# Patient Record
Sex: Female | Born: 1955
Health system: Southern US, Community
[De-identification: ages and names within clinical notes are randomized; demographics above are authoritative.]

## PROBLEM LIST (undated history)

## (undated) DIAGNOSIS — F419 Anxiety disorder, unspecified: Secondary | ICD-10-CM

## (undated) DIAGNOSIS — C3491 Malignant neoplasm of unspecified part of right bronchus or lung: Secondary | ICD-10-CM

## (undated) DIAGNOSIS — I10 Essential (primary) hypertension: Secondary | ICD-10-CM

## (undated) HISTORY — DX: Essential (primary) hypertension: I10

## (undated) HISTORY — DX: Anxiety disorder, unspecified: F41.9

## (undated) HISTORY — DX: Malignant neoplasm of unspecified part of right bronchus or lung: C34.91

## (undated) HISTORY — PX: COLONOSCOPY: SHX174

---

## 2018-03-07 DIAGNOSIS — R000 Tachycardia, unspecified: Secondary | ICD-10-CM | POA: Diagnosis not present

## 2018-03-14 DIAGNOSIS — G5603 Carpal tunnel syndrome, bilateral upper limbs: Secondary | ICD-10-CM | POA: Diagnosis not present

## 2018-03-14 DIAGNOSIS — G4739 Other sleep apnea: Secondary | ICD-10-CM | POA: Diagnosis not present

## 2018-03-14 DIAGNOSIS — R079 Chest pain, unspecified: Secondary | ICD-10-CM | POA: Diagnosis not present

## 2018-03-14 DIAGNOSIS — I499 Cardiac arrhythmia, unspecified: Secondary | ICD-10-CM | POA: Diagnosis not present

## 2018-03-17 ENCOUNTER — Other Ambulatory Visit: Payer: Self-pay | Admitting: Family Medicine

## 2018-03-17 ENCOUNTER — Ambulatory Visit
Admission: RE | Admit: 2018-03-17 | Discharge: 2018-03-17 | Disposition: A | Discharge status: Home / Self Care | Payer: BLUE CROSS/BLUE SHIELD | Source: Ambulatory Visit | Attending: Family Medicine | Admitting: Family Medicine

## 2018-03-17 DIAGNOSIS — R079 Chest pain, unspecified: Secondary | ICD-10-CM | POA: Diagnosis not present

## 2018-03-31 ENCOUNTER — Other Ambulatory Visit: Payer: Self-pay | Admitting: Family Medicine

## 2018-03-31 DIAGNOSIS — R911 Solitary pulmonary nodule: Secondary | ICD-10-CM

## 2018-04-03 ENCOUNTER — Ambulatory Visit
Admission: RE | Admit: 2018-04-03 | Discharge: 2018-04-03 | Disposition: A | Discharge status: Home / Self Care | Payer: BLUE CROSS/BLUE SHIELD | Source: Ambulatory Visit | Attending: Family Medicine | Admitting: Family Medicine

## 2018-04-03 DIAGNOSIS — R918 Other nonspecific abnormal finding of lung field: Secondary | ICD-10-CM | POA: Diagnosis not present

## 2018-04-03 DIAGNOSIS — R911 Solitary pulmonary nodule: Secondary | ICD-10-CM

## 2018-04-03 MED ORDER — IOPAMIDOL (ISOVUE-300) INJECTION 61%
75.0000 mL | Freq: Once | INTRAVENOUS | Status: AC | PRN
  Administered 2018-04-03: 75 mL via INTRAVENOUS

## 2018-04-11 DIAGNOSIS — R079 Chest pain, unspecified: Secondary | ICD-10-CM | POA: Diagnosis not present

## 2018-04-11 DIAGNOSIS — R071 Chest pain on breathing: Secondary | ICD-10-CM | POA: Diagnosis not present

## 2018-04-11 DIAGNOSIS — G4739 Other sleep apnea: Secondary | ICD-10-CM | POA: Diagnosis not present

## 2018-04-11 DIAGNOSIS — G5603 Carpal tunnel syndrome, bilateral upper limbs: Secondary | ICD-10-CM | POA: Diagnosis not present

## 2018-04-24 ENCOUNTER — Other Ambulatory Visit (HOSPITAL_BASED_OUTPATIENT_CLINIC_OR_DEPARTMENT_OTHER): Payer: Self-pay

## 2018-04-24 DIAGNOSIS — G4730 Sleep apnea, unspecified: Secondary | ICD-10-CM

## 2018-05-05 DIAGNOSIS — M542 Cervicalgia: Secondary | ICD-10-CM | POA: Diagnosis not present

## 2018-05-05 DIAGNOSIS — M79601 Pain in right arm: Secondary | ICD-10-CM | POA: Diagnosis not present

## 2018-05-05 DIAGNOSIS — M4722 Other spondylosis with radiculopathy, cervical region: Secondary | ICD-10-CM | POA: Diagnosis not present

## 2018-05-14 ENCOUNTER — Ambulatory Visit (HOSPITAL_BASED_OUTPATIENT_CLINIC_OR_DEPARTMENT_OTHER): Payer: BLUE CROSS/BLUE SHIELD | Attending: Family Medicine | Admitting: Internal Medicine

## 2018-05-14 DIAGNOSIS — G4733 Obstructive sleep apnea (adult) (pediatric): Secondary | ICD-10-CM | POA: Diagnosis not present

## 2018-05-14 DIAGNOSIS — G4730 Sleep apnea, unspecified: Secondary | ICD-10-CM

## 2018-05-22 DIAGNOSIS — G4733 Obstructive sleep apnea (adult) (pediatric): Secondary | ICD-10-CM | POA: Diagnosis not present

## 2018-05-22 NOTE — Procedures (Signed)
  Patient Name: April Rose, April Rose Date: 05/14/2018 Gender: Female D.O.B: 1955-12-12 Age (years): 61 Referring Provider: Lucianne Lei Height (inches): 49 Interpreting Physician: Baird Lyons MD, ABSM Weight (lbs): 145 RPSGT: Jacolyn Reedy BMI: 27 MRN: [id number] Neck Size: 14.00  CLINICAL INFORMATION Sleep Study Type: HST Indication for sleep study: OSA  Epworth Sleepiness Score: 15  SLEEP STUDY TECHNIQUE A multi-channel overnight portable sleep study was performed. The channels recorded were: nasal airflow, thoracic respiratory movement, and oxygen saturation with a pulse oximetry. Snoring was also monitored.  MEDICATIONS Patient self administered medications include: none reported.  SLEEP ARCHITECTURE Patient was studied for 463.7 minutes. The sleep efficiency was 100.0 % and the patient was supine for 99.8%. The arousal index was 0.0 per hour.  RESPIRATORY PARAMETERS The overall AHI was 5.4 per hour, with a central apnea index of 0.0 per hour.  The oxygen nadir was 85% during sleep.  CARDIAC DATA Mean heart rate during sleep was 73.2 bpm.  IMPRESSIONS - Mild obstructive sleep apnea occurred during this study (AHI = 5.4/h). - No significant central sleep apnea occurred during this study (CAI = 0.0/h). - Moderate oxygen desaturation was noted during this study (Min O2 = 85%, Mean 95%). - Patient snored.  DIAGNOSIS - Obstructive Sleep Apnea (327.23 [G47.33 ICD-10])  RECOMMENDATIONS - Treatment for minimal OSA is directed by symptoms. Conservative measures might include observation, weight loss, sleep position off back. - Other options such as CPAP or a fitted oral appliance, might be appropriate based on clinical judgment. - Avoid alcohol, sedatives and other CNS depressants that may worsen sleep apnea and disrupt normal sleep architecture. - Sleep hygiene should be reviewed to assess factors that may improve sleep quality. - Weight management and regular  exercise should be initiated or continued.  [Electronically signed] 05/22/2018 12:38 PM  Baird Lyons MD, [place], American Board of Sleep Medicine   NPI: [id number]                           [place]  ELECTRONICALLY SIGNED ON:  05/22/2018, 12:35 PM [place] PH: [phone]   FX: [phone] [place]

## 2018-07-03 DIAGNOSIS — R079 Chest pain, unspecified: Secondary | ICD-10-CM | POA: Diagnosis not present

## 2018-07-03 DIAGNOSIS — G4730 Sleep apnea, unspecified: Secondary | ICD-10-CM | POA: Diagnosis not present

## 2018-12-01 DIAGNOSIS — Z1212 Encounter for screening for malignant neoplasm of rectum: Secondary | ICD-10-CM | POA: Diagnosis not present

## 2018-12-01 DIAGNOSIS — Z1211 Encounter for screening for malignant neoplasm of colon: Secondary | ICD-10-CM | POA: Diagnosis not present

## 2019-05-05 DIAGNOSIS — G4739 Other sleep apnea: Secondary | ICD-10-CM | POA: Diagnosis not present

## 2019-05-05 DIAGNOSIS — R7303 Prediabetes: Secondary | ICD-10-CM | POA: Diagnosis not present

## 2019-05-05 DIAGNOSIS — Z6838 Body mass index (BMI) 38.0-38.9, adult: Secondary | ICD-10-CM | POA: Diagnosis not present

## 2019-05-05 DIAGNOSIS — H68103 Unspecified obstruction of Eustachian tube, bilateral: Secondary | ICD-10-CM | POA: Diagnosis not present

## 2019-05-05 DIAGNOSIS — E782 Mixed hyperlipidemia: Secondary | ICD-10-CM | POA: Diagnosis not present

## 2019-08-27 DIAGNOSIS — E782 Mixed hyperlipidemia: Secondary | ICD-10-CM | POA: Diagnosis not present

## 2019-08-27 DIAGNOSIS — R7303 Prediabetes: Secondary | ICD-10-CM | POA: Diagnosis not present

## 2019-08-27 DIAGNOSIS — Z7189 Other specified counseling: Secondary | ICD-10-CM | POA: Diagnosis not present

## 2019-08-27 DIAGNOSIS — T50B95A Adverse effect of other viral vaccines, initial encounter: Secondary | ICD-10-CM | POA: Diagnosis not present

## 2019-11-02 IMAGING — CR DG CHEST 2V
2 series · 2 of 2 positions shown · non-contrast
Comparison: None.

CLINICAL DATA: Right-sided chest pain

EXAM:
CHEST - 2 VIEW

[w chest pa]
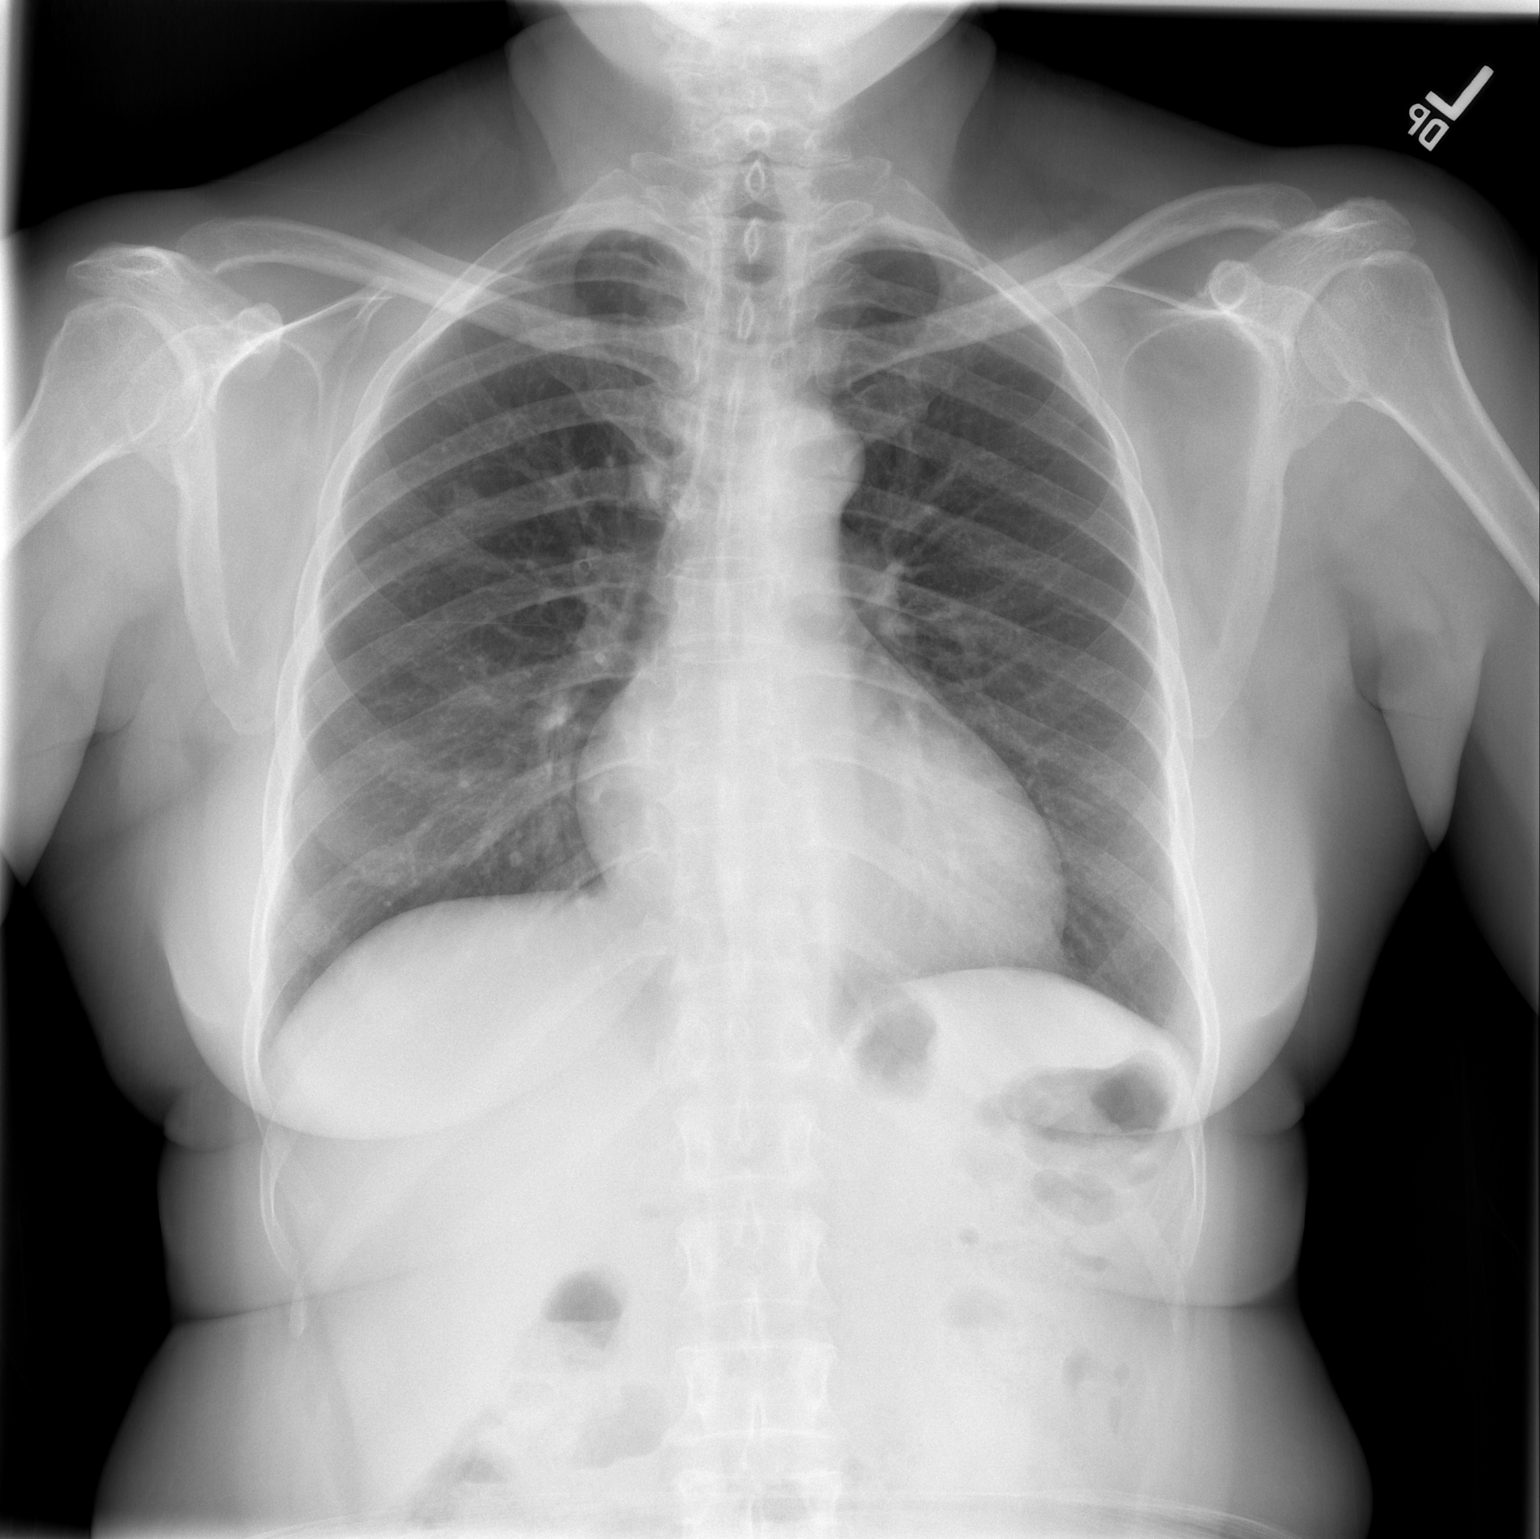

[w chest lat]
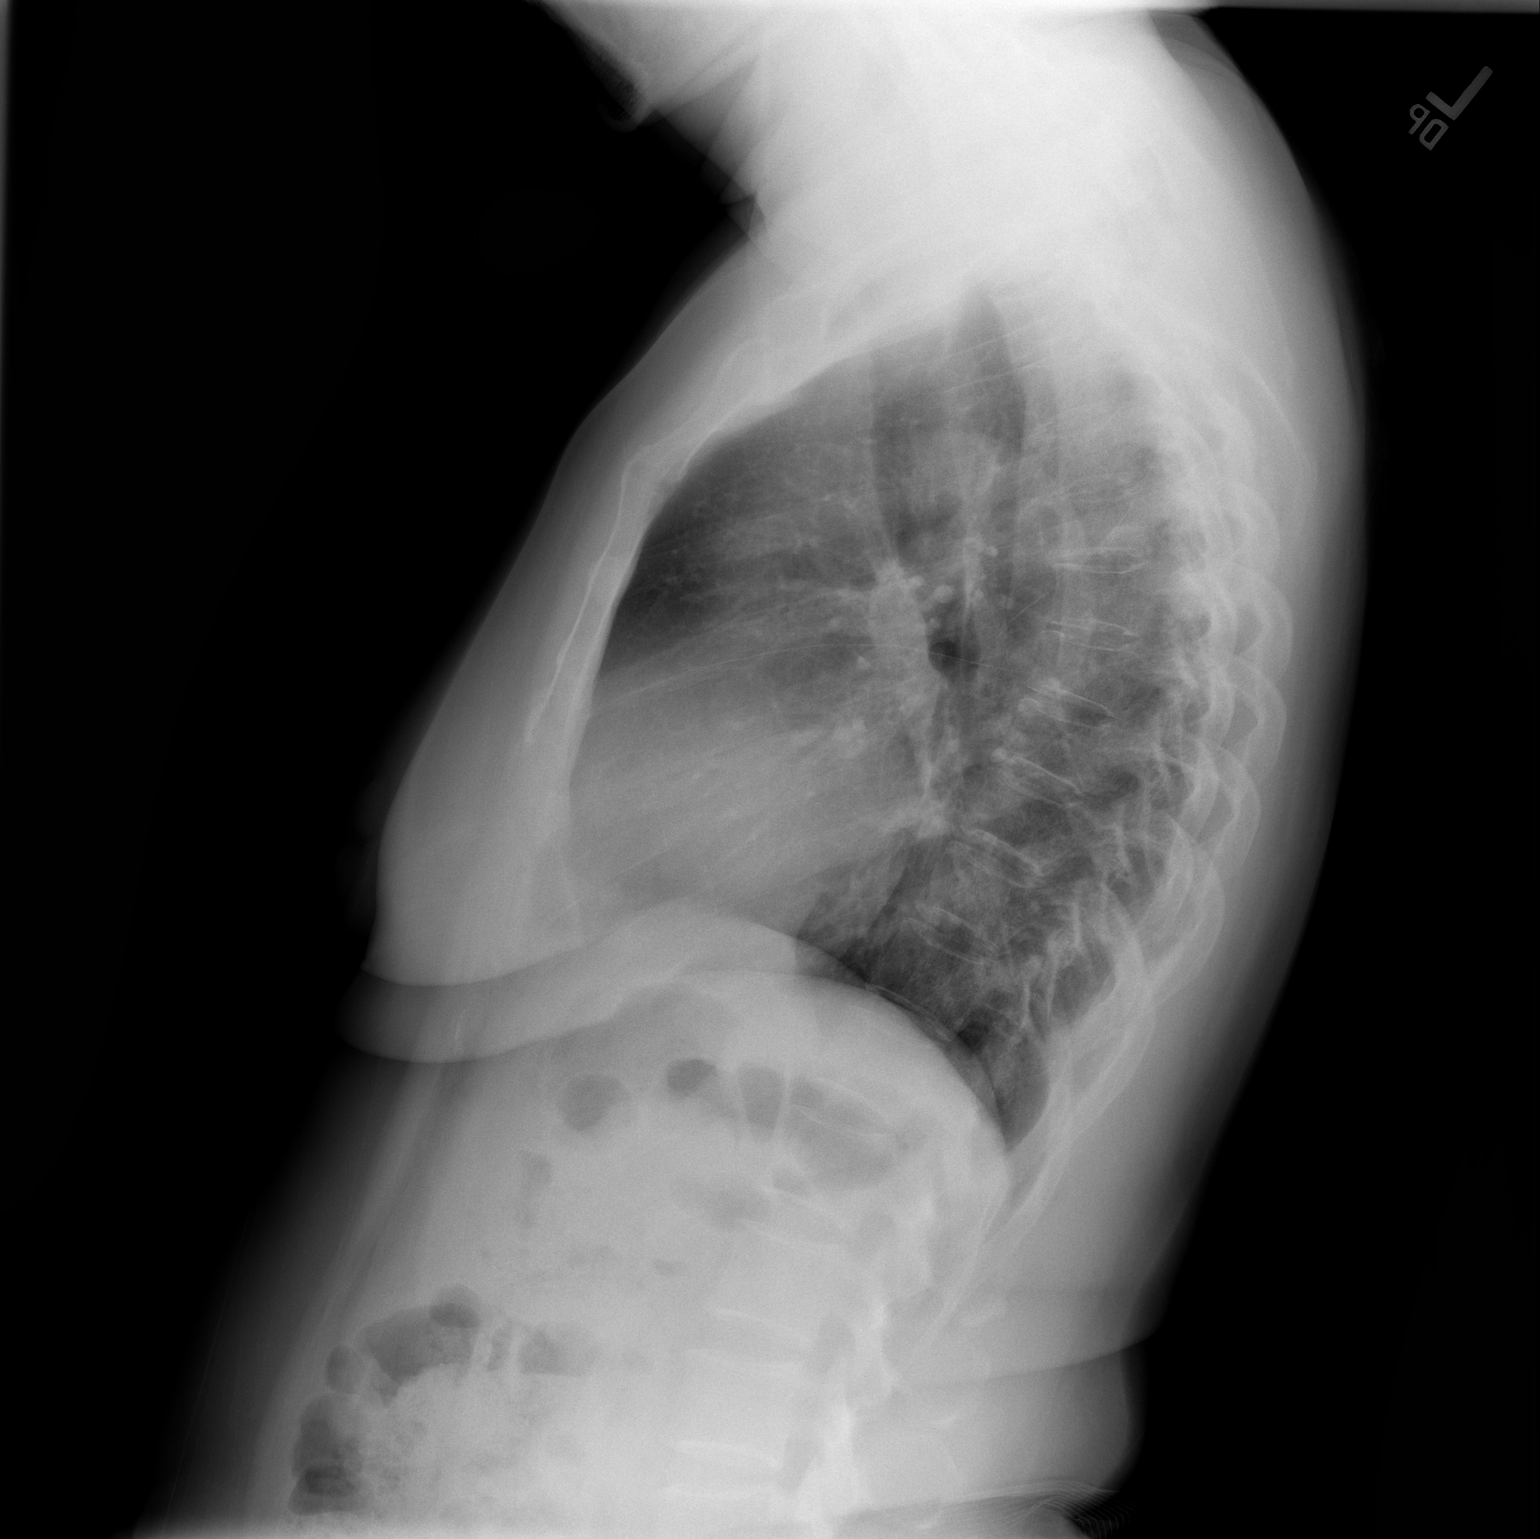

[2 of 2 positions shown; findings below may reference images not displayed]

FINDINGS: No acute pulmonary infiltrate or effusion. Normal cardiomediastinal
silhouette. No pneumothorax. Possible 8 mm right upper lobe lung
nodule.
IMPRESSION: 1. No radiographic evidence for acute cardiopulmonary abnormality
2. Possible 8 mm right upper lobe lung nodule. Recommend CT chest
for further evaluation.

These results will be called to the ordering clinician or
representative by the Radiologist Assistant, and communication
documented in the PACS or zVision Dashboard.

## 2019-11-19 IMAGING — CT CT CHEST W/ CM
2 of 4 series · 12 of 36 positions shown, 15 images · IV contrast (iopamidol)
Comparison: None.

CLINICAL DATA: RIGHT axillary pain and lateral rib pain.

EXAM:
CT CHEST WITH CONTRAST
TECHNIQUE: Multidetector CT imaging of the chest was performed during
intravenous contrast administration.
CONTRAST:  75mL W3JO5Z-GFF IOPAMIDOL (W3JO5Z-GFF) INJECTION 61%

[Series 2: chest 2.00 br40 s3 ax · axial · 0.56mm/px · z∈[+1861,+2075]mm · 9 of 127 slices shown, 12 images]
[im 10/127  mediastinal]
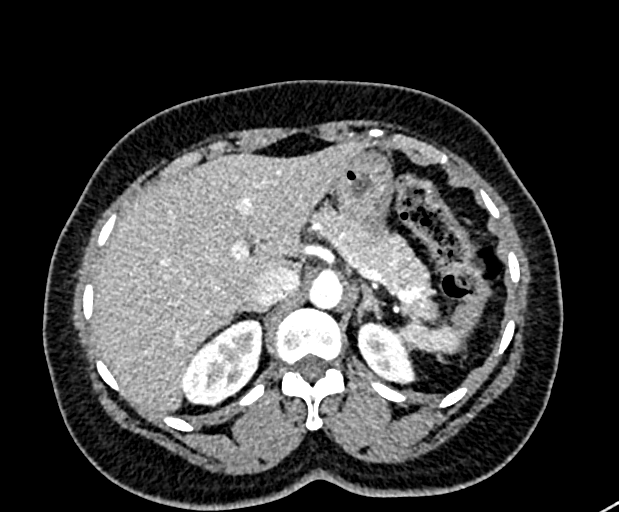
[im 10/127  lung]
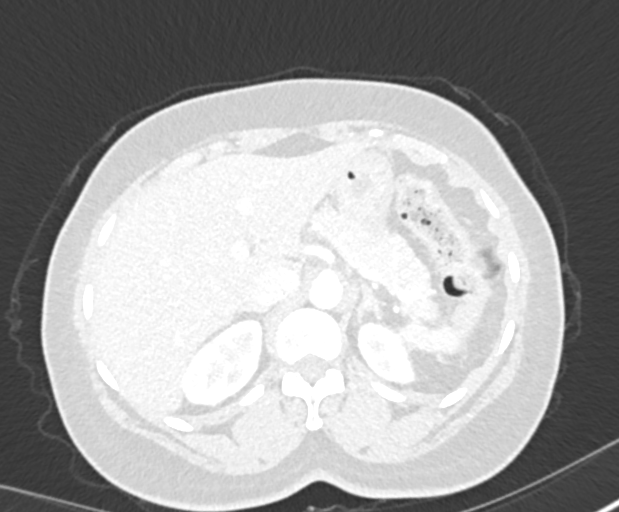
[im 30/127  lung]
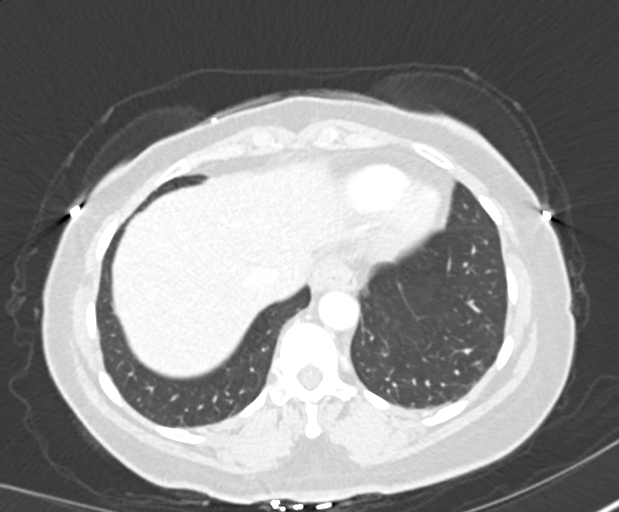
[im 39/127  lung]
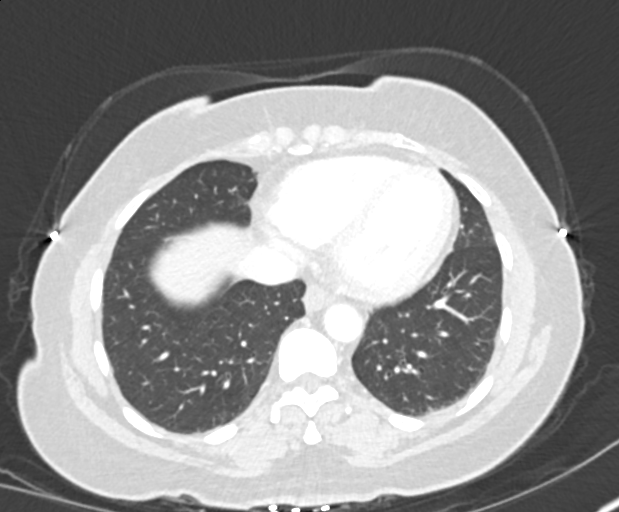
[im 49/127  lung]
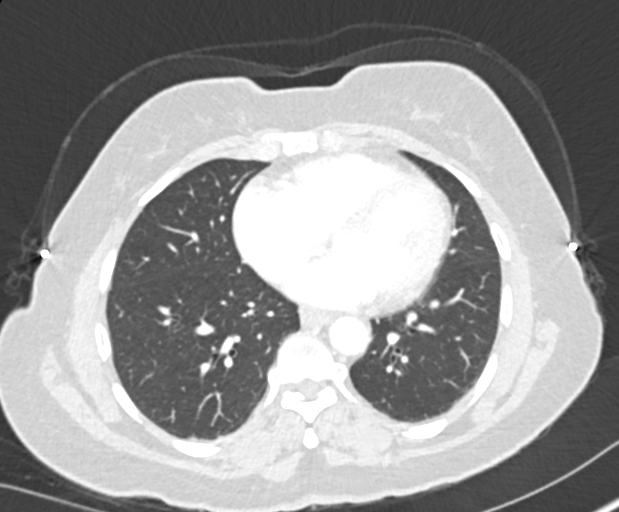
[im 68/127  mediastinal]
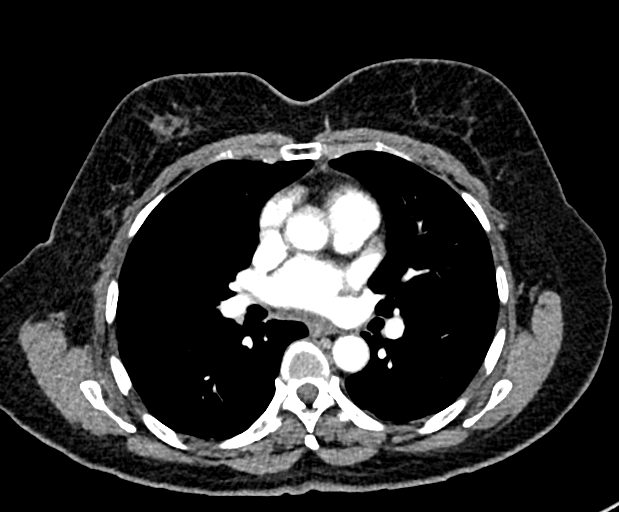
[im 68/127  lung]
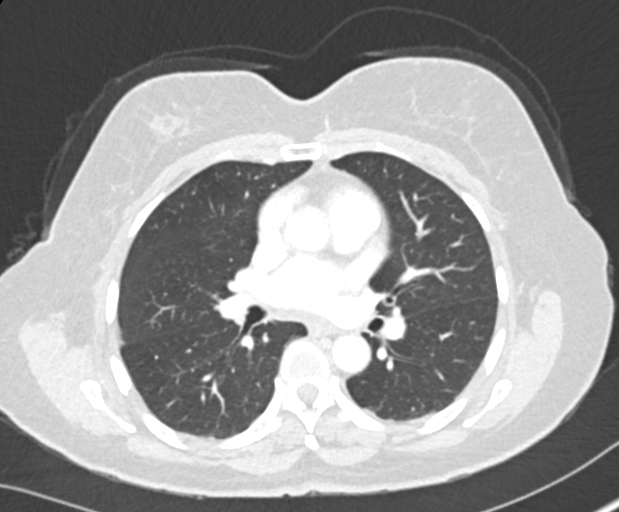
[im 78/127  lung]
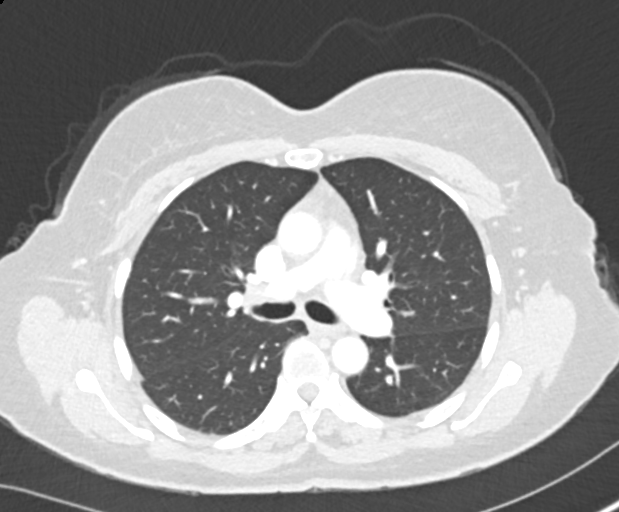
[im 88/127  lung]
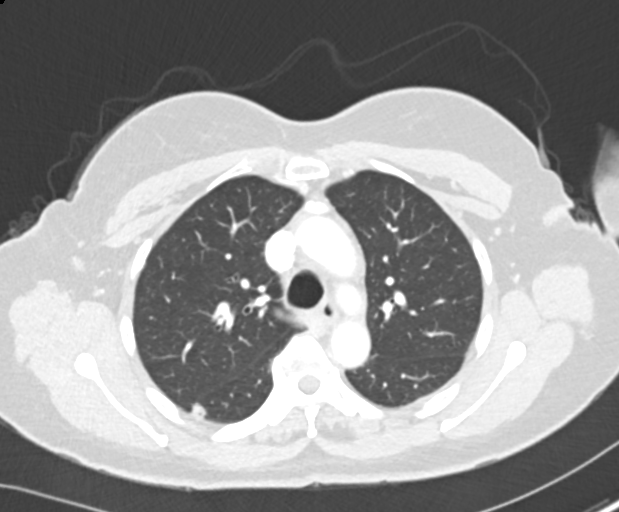
[im 107/127  lung]
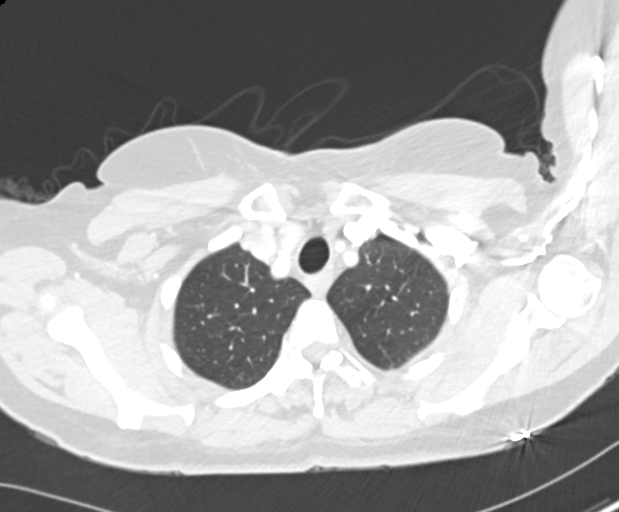
[im 117/127  mediastinal]
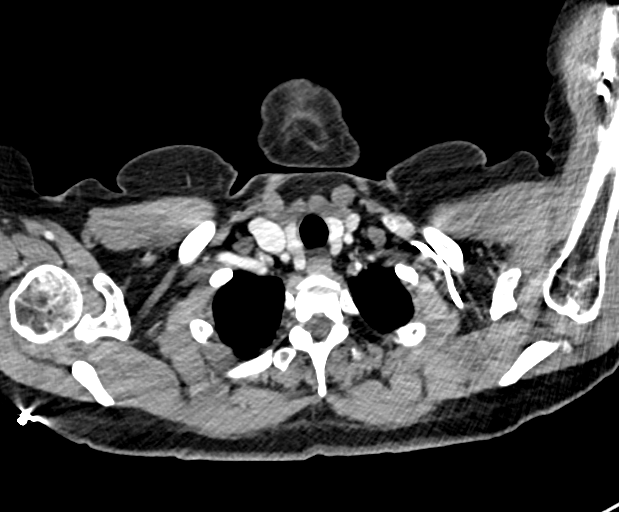
[im 117/127  lung]
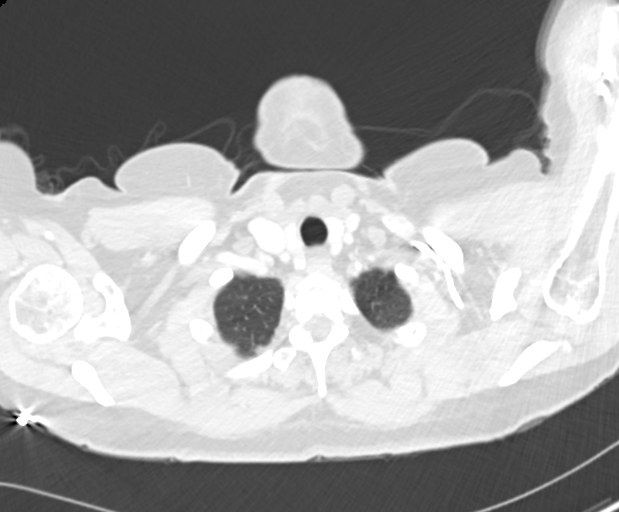

[Series 4: chest 2.00 br40 s3 cor · coronal · 0.50mm/px · 3 of 142 slices shown]
[im 29/142  lung]
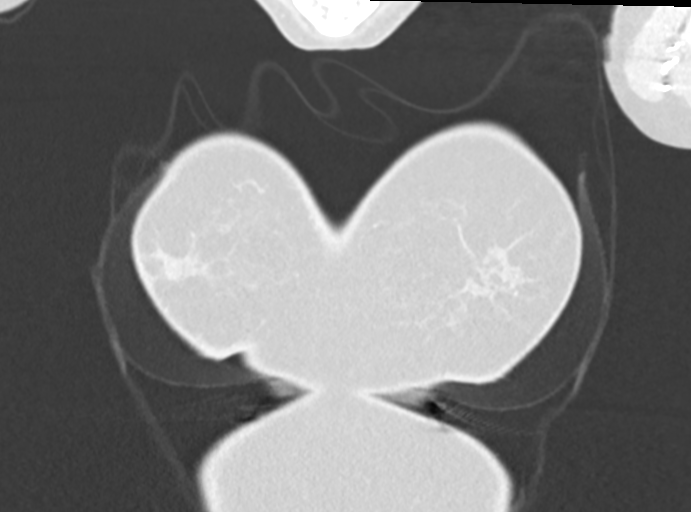
[im 57/142  lung]
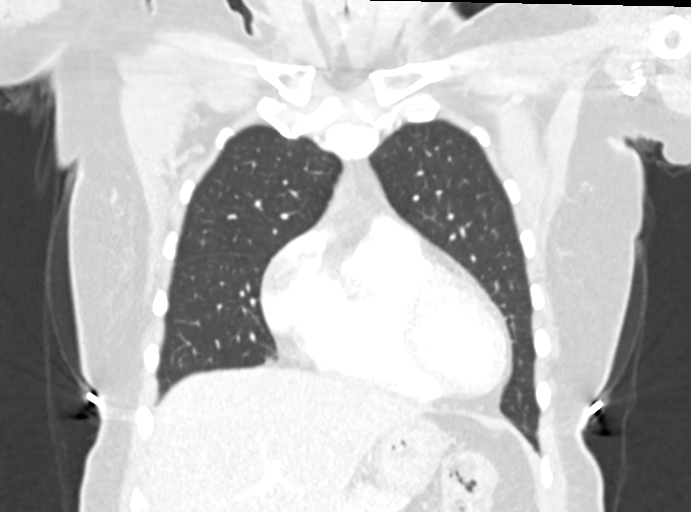
[im 85/142  lung]
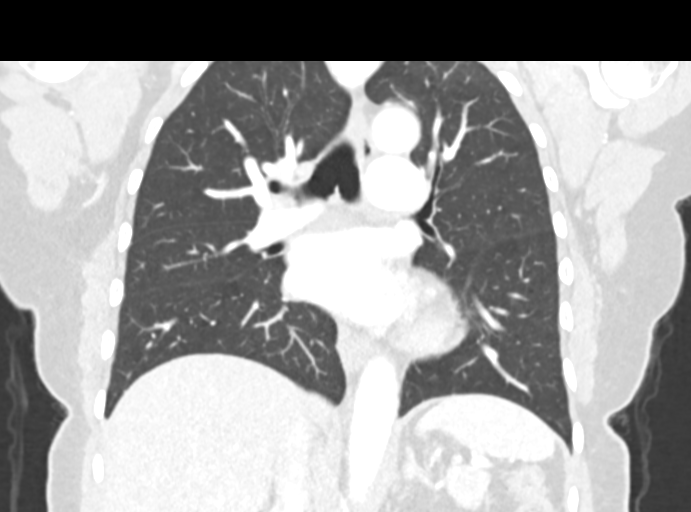

[12 of 36 positions shown; findings below may reference images not displayed]

FINDINGS: Cardiovascular: No significant vascular findings. Normal heart size.
No pericardial effusion.

Mediastinum/Nodes: No axillary supraclavicular adenopathy. No
mediastinal hilar adenopathy. Esophagus normal.

Lungs/Pleura: Subpleural nodule in the superior segment of the RIGHT
lower lobe measures 9 mm (image 41/8). No ground-glass opacities.
Airways normal.

Upper Abdomen: Limited view of the liver, kidneys, pancreas are
unremarkable. Normal adrenal glands.

Musculoskeletal: No RIGHT axillary abnormality chest wall. No rib
abnormality in the RIGHT chest wall or axilla
IMPRESSION: 1. No explanation for RIGHT axillary and RIGHT chest wall pain.
2. RIGHT lower lobe pulmonary nodule. Consider one of the following
in 3 months for both low-risk and high-risk individuals: (a) repeat
chest CT, (b) follow-up PET-CT, or (c) tissue sampling. This
recommendation follows the consensus statement: Guidelines for
Management of Incidental Pulmonary Nodules Detected on CT Images:

## 2019-12-02 DIAGNOSIS — N390 Urinary tract infection, site not specified: Secondary | ICD-10-CM | POA: Diagnosis not present

## 2020-07-28 ENCOUNTER — Other Ambulatory Visit: Payer: Self-pay | Admitting: Internal Medicine

## 2020-07-28 DIAGNOSIS — R911 Solitary pulmonary nodule: Secondary | ICD-10-CM

## 2022-01-09 DIAGNOSIS — Z79899 Other long term (current) drug therapy: Secondary | ICD-10-CM | POA: Diagnosis not present

## 2022-01-09 DIAGNOSIS — I10 Essential (primary) hypertension: Secondary | ICD-10-CM | POA: Diagnosis not present

## 2022-01-09 DIAGNOSIS — R7309 Other abnormal glucose: Secondary | ICD-10-CM | POA: Diagnosis not present

## 2022-01-16 DIAGNOSIS — R911 Solitary pulmonary nodule: Secondary | ICD-10-CM | POA: Diagnosis not present

## 2022-01-16 DIAGNOSIS — I10 Essential (primary) hypertension: Secondary | ICD-10-CM | POA: Diagnosis not present

## 2022-01-16 DIAGNOSIS — F419 Anxiety disorder, unspecified: Secondary | ICD-10-CM | POA: Diagnosis not present

## 2022-01-16 DIAGNOSIS — Z0000 Encounter for general adult medical examination without abnormal findings: Secondary | ICD-10-CM | POA: Diagnosis not present

## 2022-01-18 ENCOUNTER — Other Ambulatory Visit: Payer: Self-pay | Admitting: Internal Medicine

## 2022-01-18 DIAGNOSIS — R911 Solitary pulmonary nodule: Secondary | ICD-10-CM

## 2022-02-14 ENCOUNTER — Other Ambulatory Visit: Payer: BLUE CROSS/BLUE SHIELD

## 2022-02-22 DIAGNOSIS — Z1231 Encounter for screening mammogram for malignant neoplasm of breast: Secondary | ICD-10-CM | POA: Diagnosis not present

## 2022-03-14 ENCOUNTER — Other Ambulatory Visit: Payer: Medicare Other

## 2022-04-04 ENCOUNTER — Encounter: Payer: Self-pay | Admitting: Gastroenterology

## 2022-04-09 ENCOUNTER — Ambulatory Visit (AMBULATORY_SURGERY_CENTER): Payer: Medicare Other | Admitting: *Deleted

## 2022-04-09 VITALS — Ht 62.0 in | Wt 150.0 lb

## 2022-04-09 DIAGNOSIS — Z1211 Encounter for screening for malignant neoplasm of colon: Secondary | ICD-10-CM

## 2022-04-09 MED ORDER — PEG 3350-KCL-NA BICARB-NACL 420 G PO SOLR: 4000.0000 mL | Freq: Once | ORAL | 0 refills | Status: AC

## 2022-04-09 NOTE — Progress Notes (Signed)

## 2022-05-04 ENCOUNTER — Encounter: Payer: Self-pay | Admitting: Gastroenterology

## 2022-05-08 ENCOUNTER — Encounter: Payer: Self-pay | Admitting: Gastroenterology

## 2022-05-08 ENCOUNTER — Ambulatory Visit (AMBULATORY_SURGERY_CENTER): Payer: Medicare Other | Admitting: Gastroenterology

## 2022-05-08 VITALS — BP 120/62 | HR 65 | Temp 98.7°F | Resp 9 | Ht 62.0 in | Wt 150.0 lb

## 2022-05-08 DIAGNOSIS — Z1211 Encounter for screening for malignant neoplasm of colon: Secondary | ICD-10-CM | POA: Diagnosis not present

## 2022-05-08 DIAGNOSIS — D123 Benign neoplasm of transverse colon: Secondary | ICD-10-CM | POA: Diagnosis not present

## 2022-05-08 DIAGNOSIS — D122 Benign neoplasm of ascending colon: Secondary | ICD-10-CM

## 2022-05-08 DIAGNOSIS — D125 Benign neoplasm of sigmoid colon: Secondary | ICD-10-CM

## 2022-05-08 MED ORDER — SODIUM CHLORIDE 0.9 % IV SOLN: 500.0000 mL | Freq: Once | INTRAVENOUS | Status: DC

## 2022-05-08 NOTE — Progress Notes (Signed)
Report to PACU, RN, vss, BBS= Clear.  

## 2022-05-08 NOTE — Progress Notes (Signed)
Called to room to assist during endoscopic procedure.  Patient ID and intended procedure confirmed with present staff. Received instructions for my participation in the procedure from the performing physician.  

## 2022-05-08 NOTE — Progress Notes (Signed)
Pt's states no medical or surgical changes since previsit or office visit. 

## 2022-05-08 NOTE — Patient Instructions (Addendum)
Handouts on polyps and diverticulosis given to you today   YOU HAD AN ENDOSCOPIC PROCEDURE TODAY AT [facility]:   Refer to the procedure report that was given to you for any specific questions about what was found during the examination.  If the procedure report does not answer your questions, please call your gastroenterologist to clarify.  If you requested that your care partner not be given the details of your procedure findings, then the procedure report has been included in a sealed envelope for you to review at your convenience later.  YOU SHOULD EXPECT: Some feelings of bloating in the abdomen. Passage of more gas than usual.  Walking can help get rid of the air that was put into your GI tract during the procedure and reduce the bloating. If you had a lower endoscopy (such as a colonoscopy or flexible sigmoidoscopy) you may notice spotting of blood in your stool or on the toilet paper. If you underwent a bowel prep for your procedure, you may not have a normal bowel movement for a few days.  Please Note:  You might notice some irritation and congestion in your nose or some drainage.  This is from the oxygen used during your procedure.  There is no need for concern and it should clear up in a day or so.  SYMPTOMS TO REPORT IMMEDIATELY:  Following lower endoscopy (colonoscopy or flexible sigmoidoscopy):  Excessive amounts of blood in the stool  Significant tenderness or worsening of abdominal pains  Swelling of the abdomen that is new, acute  Fever of 100F or higher  For urgent or emergent issues, a gastroenterologist can be reached at any hour by calling [phone]. Do not use MyChart messaging for urgent concerns.    DIET:  We do recommend a small meal at first, but then you may proceed to your regular diet.  Drink plenty of fluids but you should avoid alcoholic beverages for 24 hours.  ACTIVITY:  You should plan to take it easy for the rest of today and you should  NOT DRIVE or use heavy machinery until tomorrow (because of the sedation medicines used during the test).    FOLLOW UP: Our staff will call the number listed on your records 24-72 hours following your procedure to check on you and address any questions or concerns that you may have regarding the information given to you following your procedure. If we do not reach you, we will leave a message.  We will attempt to reach you two times.  During this call, we will ask if you have developed any symptoms of COVID 19. If you develop any symptoms (ie: fever, flu-like symptoms, shortness of breath, cough etc.) before then, please call [phone].  If you test positive for Covid 19 in the 2 weeks post procedure, please call and report this information to [place].    If any biopsies were taken you will be contacted by phone or by letter within the next 1-3 weeks.  Please call us at [phone] if you have not heard about the biopsies in 3 weeks.    SIGNATURES/CONFIDENTIALITY: You and/or your care partner have signed paperwork which will be entered into your electronic medical record.  These signatures attest to the fact that that the information above on your After Visit Summary has been reviewed and is understood.  Full responsibility of the confidentiality of this discharge information lies with you and/or your care-partner.

## 2022-05-08 NOTE — Progress Notes (Signed)
[facility] History and Physical   Primary Care Physician:  Michael Boston, MD   Reason for Procedure:   Colon cancer screening  Plan:    Screening colonoscopy     HPI: April Rose is a 66 y.o. female undergoing average risk screening colonoscopy.  She has no family history of colon cancer and no chronic GI symptoms.  She had a normal colonoscopy 15 years ago and a normal Cologuard in 2019.   Past Medical History:  Diagnosis Date   Anxiety    Hypertension     Past Surgical History:  Procedure Laterality Date   COLONOSCOPY     ~ 15 yrs ago    Prior to Admission medications   Medication Sig Start Date End Date Taking? Authorizing Provider  losartan (COZAAR) 25 MG tablet Take 25 mg by mouth daily. 04/03/22  Yes [provider]  Bioflavonoid Products (ESTER C PO) Take by mouth. Daily    [provider]    Current Outpatient Medications  Medication Sig Dispense Refill   losartan (COZAAR) 25 MG tablet Take 25 mg by mouth daily.     Bioflavonoid Products (ESTER C PO) Take by mouth. Daily     Current Facility-Administered Medications  Medication Dose Route Frequency Provider Last Rate Last Admin   0.9 %  sodium chloride infusion  500 mL Intravenous Once Daryel November, MD        Allergies as of 05/08/2022 - Review Complete 05/08/2022  Allergen Reaction Noted   Lisinopril Diarrhea 04/09/2022    Family History  Problem Relation Age of Onset   Colon cancer Neg Hx    Colon polyps Neg Hx    Esophageal cancer Neg Hx    Stomach cancer Neg Hx    Rectal cancer Neg Hx     Social History   Socioeconomic History   Marital status: Married    Spouse name: Not on file   Number of children: Not on file   Years of education: Not on file   Highest education level: Not on file  Occupational History   Not on file  Tobacco Use   Smoking status: Never   Smokeless tobacco: Never  Vaping Use   Vaping Use: Never used  Substance and Sexual  Activity   Alcohol use: Yes    Comment: occ wine   Drug use: Not Currently   Sexual activity: Not on file  Other Topics Concern   Not on file  Social History Narrative   Not on file   Social Determinants of Health   Financial Resource Strain: Not on file  Food Insecurity: Not on file  Transportation Needs: Not on file  Physical Activity: Not on file  Stress: Not on file  Social Connections: Not on file  Intimate Partner Violence: Not on file    Review of Systems:  All other review of systems negative except as mentioned in the HPI.  Physical Exam: Vital signs BP 131/74   Pulse 70   Temp 98.7 F (37.1 C)   Resp (!) 9   Ht '5\' 2"'$  (1.575 m)   Wt 150 lb (68 kg)   SpO2 99%   BMI 27.44 kg/m   General:   Alert,  Well-developed, well-nourished, pleasant and cooperative in NAD Airway:  Mallampati 2 Lungs:  Clear throughout to auscultation.   Heart:  Regular rate and rhythm; no murmurs, clicks, rubs,  or gallops. Abdomen:  Soft, nontender and nondistended. Normal bowel sounds.   Neuro/Psych:  Normal  mood and affect. A and O x 3   Neyla Gauntt E. Candis Schatz, MD [facility]

## 2022-05-08 NOTE — Op Note (Signed)
[place] Patient Name: April Rose Procedure Date: 05/08/2022 8:18 AM MRN: [id number] Endoscopist: Nicki Reaper E. Candis Schatz , MD Age: 66 Referring MD:  Date of Birth: 1956/04/06 Gender: Female Account #: [id number] Procedure:                Colonoscopy Indications:              Screening for colorectal malignant neoplasm (last                            colonoscopy was more than 10 years ago) Medicines:                Monitored Anesthesia Care Procedure:                Pre-Anesthesia Assessment:                           - Prior to the procedure, a History and Physical                            was performed, and patient medications and                            allergies were reviewed. The patient's tolerance of                            previous anesthesia was also reviewed. The risks                            and benefits of the procedure and the sedation                            options and risks were discussed with the patient.                            All questions were answered, and informed consent                            was obtained. Prior Anticoagulants: The patient has                            taken no previous anticoagulant or antiplatelet                            agents. ASA Grade Assessment: II - A patient with                            mild systemic disease. After reviewing the risks                            and benefits, the patient was deemed in                            satisfactory condition to undergo the procedure.  After obtaining informed consent, the colonoscope                            was passed under direct vision. Throughout the                            procedure, the patient's blood pressure, pulse, and                            oxygen saturations were monitored continuously. The                            CF [id number] #[id number] was introduced through the anus                            and advanced  to the the cecum, identified by                            appendiceal orifice and ileocecal valve. The                            colonoscopy was performed without difficulty. The                            patient tolerated the procedure well. The quality                            of the bowel preparation was good. The ileocecal                            valve, appendiceal orifice, and rectum were                            photographed. The bowel preparation used was                            GoLYTELY via split dose instruction. Scope In: 8:34:51 AM Scope Out: 9:00:07 AM Scope Withdrawal Time: 0 hours 19 minutes 16 seconds  Total Procedure Duration: 0 hours 25 minutes 16 seconds  Findings:                 The perianal and digital rectal examinations were                            normal. Pertinent negatives include normal                            sphincter tone and no palpable rectal lesions.                           Four sessile polyps were found in the ascending                            colon. The polyps were 2 to 4 mm in size.  These                            polyps were removed with a cold snare. Resection                            and retrieval were complete. Estimated blood loss                            was minimal.                           Three flat and sessile polyps were found in the                            hepatic flexure. The polyps were 2 to 7 mm in size.                            These polyps were removed with a cold snare.                            Resection and retrieval were complete. Estimated                            blood loss was minimal.                           Multiple flat, non-bleeding polyps were found in                            the sigmoid colon. The polyps were 1 to 3 mm in                            size. One of these polyps were removed with a cold                            snare. Resection and retrieval were complete.                             Estimated blood loss was minimal.                           Multiple small and large-mouthed diverticula were                            found in the ascending colon.                           The exam was otherwise normal throughout the                            examined colon.                           No additional abnormalities  were found on                            retroflexion. Complications:            No immediate complications. Estimated Blood Loss:     Estimated blood loss was minimal. Impression:               - Four 2 to 4 mm polyps in the ascending colon,                            removed with a cold snare. Resected and retrieved.                           - Three 2 to 7 mm polyps at the hepatic flexure,                            removed with a cold snare. Resected and retrieved.                           - Multiple 1 to 3 mm, non-bleeding polyps in the                            sigmoid colon, removed with a cold snare. Resected                            and retrieved.                           - Diverticulosis in the ascending colon. Recommendation:           - Patient has a contact number available for                            emergencies. The signs and symptoms of potential                            delayed complications were discussed with the                            patient. Return to normal activities tomorrow.                            Written discharge instructions were provided to the                            patient.                           - Resume previous diet.                           - Continue present medications.                           - Await pathology results.                           -  Repeat colonoscopy (date not yet determined) for                            surveillance based on pathology results. Pauline Trainer E. Candis Schatz, MD 05/08/2022 9:08:13 AM This report has been signed electronically.

## 2022-05-09 ENCOUNTER — Telehealth: Payer: Self-pay

## 2022-05-09 NOTE — Telephone Encounter (Signed)
  Follow up Call-     05/08/2022    7:45 AM  Call back number  Post procedure Call Back phone  # [phone]  Permission to leave phone message Yes     Patient questions:  Do you have a fever, pain , or abdominal swelling? No. Pain Score  0 *  Have you tolerated food without any problems? Yes.    Have you been able to return to your normal activities? Yes.    Do you have any questions about your discharge instructions: Diet   No. Medications  No. Follow up visit  No.  Do you have questions or concerns about your Care? No.  Actions: * If pain score is 4 or above: No action needed, pain <4.

## 2022-05-15 ENCOUNTER — Encounter: Payer: Self-pay | Admitting: Gastroenterology

## 2022-12-03 HISTORY — PX: COLONOSCOPY: SHX174

## 2022-12-10 ENCOUNTER — Ambulatory Visit
Admission: RE | Admit: 2022-12-10 | Discharge: 2022-12-10 | Disposition: A | Discharge status: Home / Self Care | Payer: Medicare Other | Source: Ambulatory Visit | Attending: Internal Medicine | Admitting: Internal Medicine

## 2022-12-10 DIAGNOSIS — R911 Solitary pulmonary nodule: Secondary | ICD-10-CM

## 2022-12-19 ENCOUNTER — Encounter (HOSPITAL_COMMUNITY): Payer: Self-pay | Admitting: Internal Medicine

## 2022-12-19 ENCOUNTER — Other Ambulatory Visit (HOSPITAL_COMMUNITY): Payer: Self-pay | Admitting: Internal Medicine

## 2022-12-19 DIAGNOSIS — R911 Solitary pulmonary nodule: Secondary | ICD-10-CM

## 2022-12-19 NOTE — Progress Notes (Signed)
April Edouard, MD  Donita Brooks D Cancel biopsy request. I left message for provider through her nurse.  GY

## 2023-01-23 ENCOUNTER — Encounter: Payer: Self-pay | Admitting: Thoracic Surgery (Cardiothoracic Vascular Surgery)

## 2023-01-23 ENCOUNTER — Other Ambulatory Visit: Payer: Self-pay | Admitting: Thoracic Surgery (Cardiothoracic Vascular Surgery)

## 2023-01-23 ENCOUNTER — Institutional Professional Consult (permissible substitution): Payer: Medicare Other | Admitting: Thoracic Surgery (Cardiothoracic Vascular Surgery)

## 2023-01-23 ENCOUNTER — Other Ambulatory Visit: Payer: Self-pay | Admitting: *Deleted

## 2023-01-23 VITALS — BP 147/86 | HR 79 | Resp 18 | Ht 62.0 in | Wt 155.0 lb

## 2023-01-23 DIAGNOSIS — R911 Solitary pulmonary nodule: Secondary | ICD-10-CM | POA: Diagnosis not present

## 2023-01-23 NOTE — Progress Notes (Signed)
PCP is Wile, Jesse Sans, MD Referring Provider is Michael Boston, MD  Chief Complaint  Patient presents with   Lung Lesion    CT chest 1/8    HPI: April Rose is sent for consultation regarding a lung nodule.  April Rose is a 67 year old woman with a history of hypertension, anxiety, and a lung nodule on a CT scan back in 2019.  She says that she recently had a x-ray of her arm and was noted to have a lung nodule.  She had a CT of the chest which showed a 1.6 x 1.4 x 1.3 cm cavitary nodule in the superior segment of the right lower lobe.  She had had a CT of the chest back in 2019 which showed a 9 mm solid-appearing nodule.  There also was a 5 mm groundglass nodule in the left lower lobe that was unchanged from 2019.  She is a lifelong non-smoker.  No coughing, wheezing, shortness of breath, chest pain, pressure, or tightness.  No overt fevers, chills, or night sweats.  Works as an Corporate treasurer.  Originally from [place].  Has been in the [place] for 30 years, but does travel back occasionally.   Zubrod Score: At the time of surgery this patient's most appropriate activity status/level should be described as: [x]$     0    Normal activity, no symptoms []$     1    Restricted in physical strenuous activity but ambulatory, able to do out light work []$     2    Ambulatory and capable of self care, unable to do work activities, up and about >50 % of waking hours                              []$     3    Only limited self care, in bed greater than 50% of waking hours []$     4    Completely disabled, no self care, confined to bed or chair []$     5    Moribund  Past Medical History:  Diagnosis Date   Anxiety    Hypertension     Past Surgical History:  Procedure Laterality Date   COLONOSCOPY     ~ 15 yrs ago    Family History  Problem Relation Age of Onset   Colon cancer Neg Hx    Colon polyps Neg Hx    Esophageal cancer Neg Hx    Stomach cancer Neg Hx    Rectal cancer Neg Hx     Social  History Social History   Tobacco Use   Smoking status: Never   Smokeless tobacco: Never  Vaping Use   Vaping Use: Never used  Substance Use Topics   Alcohol use: Yes    Comment: occ wine   Drug use: Not Currently    Current Outpatient Medications  Medication Sig Dispense Refill   Bioflavonoid Products (ESTER C PO) Take by mouth. Daily     olmesartan (BENICAR) 5 MG tablet TAKE 1 TABLET BY MOUTH DAILY for 90 days     No current facility-administered medications for this visit.    Allergies  Allergen Reactions   Lisinopril Diarrhea    Review of Systems  Constitutional:  Negative for activity change, appetite change, chills, diaphoresis, fatigue, fever and unexpected weight change.  HENT:  Positive for dental problem (Dentures).   Respiratory:  Negative for cough, shortness of breath and wheezing.   Cardiovascular:  Negative for chest pain and leg swelling.  Genitourinary:  Negative for difficulty urinating and dysuria.  Musculoskeletal:  Positive for arthralgias and joint swelling.  Neurological:  Negative for seizures, syncope and weakness.  Psychiatric/Behavioral:  The patient is nervous/anxious.   All other systems reviewed and are negative.   BP (!) 147/86 (BP Location: Left Arm, Patient Position: Sitting)   Pulse 79   Resp 18   Ht 5' 2"$  (1.575 m)   Wt 155 lb (70.3 kg)   SpO2 95% Comment: RA  BMI 28.35 kg/m  Physical Exam Vitals reviewed.  Constitutional:      General: She is not in acute distress.    Appearance: Normal appearance.  HENT:     Head: Normocephalic and atraumatic.  Eyes:     General: No scleral icterus.    Extraocular Movements: Extraocular movements intact.  Cardiovascular:     Rate and Rhythm: Normal rate and regular rhythm.     Heart sounds: Normal heart sounds. No murmur heard.    No friction rub. No gallop.  Pulmonary:     Effort: Pulmonary effort is normal. No respiratory distress.     Breath sounds: Normal breath sounds. No  wheezing.  Abdominal:     General: There is no distension.     Palpations: Abdomen is soft.     Tenderness: There is no abdominal tenderness.  Musculoskeletal:     Cervical back: Neck supple.  Lymphadenopathy:     Cervical: No cervical adenopathy.  Skin:    General: Skin is warm and dry.  Neurological:     General: No focal deficit present.     Mental Status: She is alert and oriented to person, place, and time.     Cranial Nerves: No cranial nerve deficit.     Motor: No weakness.      Diagnostic Tests: CT CHEST WITHOUT CONTRAST   TECHNIQUE: Multidetector CT imaging of the chest was performed following the standard protocol without IV contrast.   RADIATION DOSE REDUCTION: This exam was performed according to the departmental dose-optimization program which includes automated exposure control, adjustment of the mA and/or kV according to patient size and/or use of iterative reconstruction technique.   COMPARISON:  04/03/2018.   FINDINGS: Cardiovascular: Heart normal in size and configuration. No pericardial effusion. No coronary artery calcifications. Great vessels are normal caliber. No aortic atherosclerotic calcifications.   Mediastinum/Nodes: No enlarged mediastinal or axillary lymph nodes. Thyroid gland, trachea, and esophagus demonstrate no significant findings.   Lungs/Pleura: Cavitary nodule, right lower lobe, superior segment, image 35, series 8, currently measuring 1.6 cm superior to inferior by 1.4 x 1.3 cm transversely, previously 0.9 cm transversely.   5 mm ground-glass nodule, left lower lobe, superior segment, image 32, series 8, unchanged.   Remainder of the lungs is clear. No pleural effusion or pneumothorax.   Upper Abdomen: Small sliding hiatal hernia. Visualized upper abdominal structures otherwise unremarkable.   Musculoskeletal: No chest wall mass or suspicious bone lesions identified.   IMPRESSION: 1. Previously described right lower  lobe, superior segment nodule has increased in size, previously 9 mm, currently 1.6 x 1.3 x 1.4 cm. Nodule is now cavitary. This is suspicious for low-grade malignancy. Tissue sampling is recommended. 2. 5 mm ground-glass nodule, left lower lung, stable consistent with a benign etiology 3. No acute findings in the chest. Small hiatal hernia. No other abnormalities.     Electronically Signed   By: Lajean Manes M.D.   On: 12/11/2022 12:49  I personally reviewed the CT images.  There is a cavitary nodule in the superior segment of the right lower lobe that is increased in size compared to 2019.  No mediastinal or hilar adenopathy.  Stable 5 mm groundglass nodule in the left lower lobe.  Impression: April Rose is a 67 year old woman with a history of hypertension, anxiety, and a lung nodule.  She is a lifelong non-smoker.  In generally good health and not symptomatic currently.  Right lower lobe nodule-has been present for 5 years.  Has increased in size and become more cavitary over time.  Differential diagnosis includes infectious or inflammatory nodules and primary bronchogenic carcinoma.  Cannot rule out a low-grade adenocarcinoma.  However, I suspect this is likely an atypical infectious process.  Given the increase in size over time I think the best option is to go ahead and do an an excision.  A negative biopsy would not completely rule out the possibility that this is a low-grade cancer.  Given the slow progression in the absence of adenopathy, I do not think a PET scan is warranted ahead of time.  If it does turn out to be a low-grade adenocarcinoma we can do a PET postoperatively.  We will get PFTs preoperatively.  Left lower lobe nodule-5 mm groundglass opacity unchanged in 5 years.  Consistent with benign etiology.  I recommended that we proceed with a robotic assisted right VATS for wedge resection.  Possible superior segmentectomy if frozen section shows cancer.  I  informed her of the general nature of the procedure including the need for general anesthesia, the incisions to be used, the use of the surgical robot, the intraoperative decision making based on frozen section, the use of drains to postoperatively, the expected hospital stay, and the overall recovery.  I informed her of the indications, risks, benefits, and alternatives.  She understands the risks include, but not limited to death, MI, DVT, PE, bleeding, possible need for transfusion, infection, cardiac arrhythmias, air leaks, as well as possibility of other unforeseeable complications.  She has plans to travel to [place] for the month of April.  I think if we did the surgery now she would not be able to make that trip.  Given the time course of this lesion I think is reasonable to go ahead and go on her trip and then do the surgery when she returns.  I will see her back after she returns to make sure she still wishes to proceed.  Plan:  PFTs Will return to the office end of April after returning from [place] with tentative plan to proceed with surgical resection on Monday, 04/08/2023.  Melrose Nakayama, MD [facility] [phone]

## 2023-01-24 ENCOUNTER — Encounter: Payer: Self-pay | Admitting: *Deleted

## 2023-01-29 DIAGNOSIS — R030 Elevated blood-pressure reading, without diagnosis of hypertension: Secondary | ICD-10-CM | POA: Diagnosis not present

## 2023-01-29 DIAGNOSIS — E785 Hyperlipidemia, unspecified: Secondary | ICD-10-CM | POA: Diagnosis not present

## 2023-01-29 DIAGNOSIS — I10 Essential (primary) hypertension: Secondary | ICD-10-CM | POA: Diagnosis not present

## 2023-01-29 DIAGNOSIS — R002 Palpitations: Secondary | ICD-10-CM | POA: Diagnosis not present

## 2023-01-29 DIAGNOSIS — R7303 Prediabetes: Secondary | ICD-10-CM | POA: Diagnosis not present

## 2023-02-05 ENCOUNTER — Encounter: Payer: Medicare Other | Admitting: Thoracic Surgery (Cardiothoracic Vascular Surgery)

## 2023-02-05 DIAGNOSIS — Z0000 Encounter for general adult medical examination without abnormal findings: Secondary | ICD-10-CM | POA: Diagnosis not present

## 2023-02-05 DIAGNOSIS — I10 Essential (primary) hypertension: Secondary | ICD-10-CM | POA: Diagnosis not present

## 2023-02-15 DIAGNOSIS — H5213 Myopia, bilateral: Secondary | ICD-10-CM | POA: Diagnosis not present

## 2023-03-28 ENCOUNTER — Inpatient Hospital Stay (HOSPITAL_COMMUNITY)
Admission: RE | Admit: 2023-03-28 | Discharge: 2023-03-28 | Disposition: A | Discharge status: Home / Self Care | Payer: Medicare Other | Source: Ambulatory Visit | Attending: Thoracic Surgery (Cardiothoracic Vascular Surgery) | Admitting: Thoracic Surgery (Cardiothoracic Vascular Surgery)

## 2023-03-28 ENCOUNTER — Ambulatory Visit: Payer: Medicare Other | Admitting: Thoracic Surgery (Cardiothoracic Vascular Surgery)

## 2023-03-29 ENCOUNTER — Ambulatory Visit (HOSPITAL_COMMUNITY)
Admission: RE | Admit: 2023-03-29 | Discharge: 2023-03-29 | Disposition: A | Discharge status: Home / Self Care | Payer: Medicare Other | Source: Ambulatory Visit | Attending: Thoracic Surgery (Cardiothoracic Vascular Surgery) | Admitting: Thoracic Surgery (Cardiothoracic Vascular Surgery)

## 2023-03-29 DIAGNOSIS — R911 Solitary pulmonary nodule: Secondary | ICD-10-CM

## 2023-03-29 DIAGNOSIS — Z01818 Encounter for other preprocedural examination: Secondary | ICD-10-CM | POA: Diagnosis not present

## 2023-03-29 LAB — PULMONARY FUNCTION TEST
DL/VA % pred: 114 %
DL/VA: 4.85 ml/min/mmHg/L
DLCO unc % pred: 71 %
DLCO unc: 13.2 ml/min/mmHg
FEF 25-75 Post: 1.6 L/sec
FEF 25-75 Pre: 1.62 L/sec
FEF2575-%Change-Post: 0 %
FEF2575-%Pred-Post: 81 %
FEF2575-%Pred-Pre: 82 %
FEV1-%Change-Post: -1 %
FEV1-%Pred-Post: 64 %
FEV1-%Pred-Pre: 65 %
FEV1-Post: 1.43 L
FEV1-Pre: 1.44 L
FEV1FVC-%Change-Post: 3 %
FEV1FVC-%Pred-Pre: 107 %
FEV6-%Change-Post: -4 %
FEV6-%Pred-Post: 60 %
FEV6-%Pred-Pre: 62 %
FEV6-Post: 1.66 L
FEV6-Pre: 1.74 L
FEV6FVC-%Change-Post: 0 %
FEV6FVC-%Pred-Post: 104 %
FEV6FVC-%Pred-Pre: 103 %
FVC-%Change-Post: -4 %
FVC-%Pred-Post: 57 %
FVC-%Pred-Pre: 60 %
FVC-Post: 1.66 L
FVC-Pre: 1.75 L
Post FEV1/FVC ratio: 86 %
Post FEV6/FVC ratio: 100 %
Pre FEV1/FVC ratio: 83 %
Pre FEV6/FVC Ratio: 99 %
RV % pred: 99 %
RV: 2 L
TLC % pred: 79 %
TLC: 3.78 L

## 2023-03-29 MED ORDER — ALBUTEROL SULFATE (2.5 MG/3ML) 0.083% IN NEBU
2.5000 mg | INHALATION_SOLUTION | Freq: Once | RESPIRATORY_TRACT | Status: AC
  Administered 2023-03-29: 2.5 mg via RESPIRATORY_TRACT

## 2023-04-01 ENCOUNTER — Ambulatory Visit: Payer: Medicare Other | Admitting: Thoracic Surgery (Cardiothoracic Vascular Surgery)

## 2023-04-01 VITALS — BP 149/75 | HR 69 | Resp 20 | Ht 62.0 in | Wt 155.0 lb

## 2023-04-01 DIAGNOSIS — R911 Solitary pulmonary nodule: Secondary | ICD-10-CM

## 2023-04-01 NOTE — Progress Notes (Signed)
PCP is Wile, Nyoka Cowden, MD Referring Provider is Nadene Rubins Nyoka Cowden, MD  Chief Complaint  Patient presents with   Lung Lesion    Further discuss surgery    HPI: April Rose returns to discuss surgery for her right lower lobe lung nodule.  April Rose is a 68 year old woman with a history of hypertension, anxiety, and a lung nodule on a CT scan back in 2019.  She says that she recently had a x-ray of her arm and was noted to have a lung nodule.  She had a CT of the chest which showed a 1.6 x 1.4 x 1.3 cm cavitary nodule in the superior segment of the right lower lobe.  She had had a CT of the chest back in 2019 which showed a 9 mm solid-appearing nodule.  There also was a 5 mm groundglass nodule in the left lower lobe that was unchanged from 2019.   She is a lifelong non-smoker.  No coughing, wheezing, shortness of breath, chest pain, pressure, or tightness.  No overt fevers, chills, or night sweats.  Works as an Public house manager.  Originally from [place].  Has been in the [place] for 30 years, but does travel back occasionally.  I saw her back in February but she wanted to wait until May for her surgery due to a trip she had planned to [place].  She she went to [place] and recently returned.  She did note some swelling in her legs after each leg of the trip but it was equal bilaterally and resolved.  No calf pain or tenderness.  Zubrod Score: At the time of surgery this patient's most appropriate activity status/level should be described as: [x]     0    Normal activity, no symptoms []     1    Restricted in physical strenuous activity but ambulatory, able to do out light work []     2    Ambulatory and capable of self care, unable to do work activities, up and about >50 % of waking hours                              []     3    Only limited self care, in bed greater than 50% of waking hours []     4    Completely disabled, no self care, confined to bed or chair []     5    Moribund  Past Medical History:  Diagnosis  Date   Anxiety    Hypertension     Past Surgical History:  Procedure Laterality Date   COLONOSCOPY     ~ 15 yrs ago    Family History  Problem Relation Age of Onset   Colon cancer Neg Hx    Colon polyps Neg Hx    Esophageal cancer Neg Hx    Stomach cancer Neg Hx    Rectal cancer Neg Hx     Social History Social History   Tobacco Use   Smoking status: Never   Smokeless tobacco: Never  Vaping Use   Vaping Use: Never used  Substance Use Topics   Alcohol use: Yes    Comment: occ wine   Drug use: Not Currently    Current Outpatient Medications  Medication Sig Dispense Refill   Bioflavonoid Products (ESTER C PO) Take by mouth. Daily     olmesartan (BENICAR) 5 MG tablet TAKE 1 TABLET BY MOUTH DAILY for 90 days  No current facility-administered medications for this visit.    Allergies  Allergen Reactions   Lisinopril Diarrhea    Review of Systems  Constitutional:  Negative for activity change, chills and fever.  HENT:  Positive for dental problem (Dentures). Negative for trouble swallowing and voice change.   Respiratory:  Negative for cough, shortness of breath and wheezing.   Cardiovascular:  Positive for leg swelling (See HPI). Negative for chest pain and palpitations.  Musculoskeletal:  Positive for arthralgias and joint swelling.  Psychiatric/Behavioral:  The patient is nervous/anxious.   All other systems reviewed and are negative.   BP (!) 149/75   Pulse 69   Resp 20   Ht 5\' 2"  (1.575 m)   Wt 155 lb (70.3 kg)   SpO2 98% Comment: RA  BMI 28.35 kg/m  Physical Exam Vitals reviewed.  Constitutional:      General: She is not in acute distress.    Appearance: Normal appearance.  HENT:     Head: Normocephalic and atraumatic.  Eyes:     General: No scleral icterus.    Extraocular Movements: Extraocular movements intact.  Cardiovascular:     Rate and Rhythm: Normal rate and regular rhythm.     Heart sounds: Normal heart sounds. No murmur heard.     No friction rub. No gallop.  Pulmonary:     Effort: Pulmonary effort is normal. No respiratory distress.     Breath sounds: Normal breath sounds. No wheezing or rales.  Abdominal:     General: There is no distension.     Palpations: Abdomen is soft.  Musculoskeletal:     Right lower leg: No edema.     Left lower leg: No edema.  Skin:    General: Skin is warm and dry.  Neurological:     General: No focal deficit present.     Mental Status: She is alert and oriented to person, place, and time.     Cranial Nerves: No cranial nerve deficit.     Motor: No weakness.      Diagnostic Tests: CT CHEST WITHOUT CONTRAST   TECHNIQUE: Multidetector CT imaging of the chest was performed following the standard protocol without IV contrast.   RADIATION DOSE REDUCTION: This exam was performed according to the departmental dose-optimization program which includes automated exposure control, adjustment of the mA and/or kV according to patient size and/or use of iterative reconstruction technique.   COMPARISON:  04/03/2018.   FINDINGS: Cardiovascular: Heart normal in size and configuration. No pericardial effusion. No coronary artery calcifications. Great vessels are normal caliber. No aortic atherosclerotic calcifications.   Mediastinum/Nodes: No enlarged mediastinal or axillary lymph nodes. Thyroid gland, trachea, and esophagus demonstrate no significant findings.   Lungs/Pleura: Cavitary nodule, right lower lobe, superior segment, image 35, series 8, currently measuring 1.6 cm superior to inferior by 1.4 x 1.3 cm transversely, previously 0.9 cm transversely.   5 mm ground-glass nodule, left lower lobe, superior segment, image 32, series 8, unchanged.   Remainder of the lungs is clear. No pleural effusion or pneumothorax.   Upper Abdomen: Small sliding hiatal hernia. Visualized upper abdominal structures otherwise unremarkable.   Musculoskeletal: No chest wall mass or  suspicious bone lesions identified.   IMPRESSION: 1. Previously described right lower lobe, superior segment nodule has increased in size, previously 9 mm, currently 1.6 x 1.3 x 1.4 cm. Nodule is now cavitary. This is suspicious for low-grade malignancy. Tissue sampling is recommended. 2. 5 mm ground-glass nodule, left lower lung, stable consistent  with a benign etiology 3. No acute findings in the chest. Small hiatal hernia. No other abnormalities.     Electronically Signed   By: Amie Portland M.D.   On: 12/11/2022 12:49 I again reviewed the CT images.  There is a 1.5 cm cavitary nodule in the superior segment of the right lower lobe.  Stable left lower lobe lung nodule.  Pulmonary function testing FVC 1.75 (60%) FEV1 1.44 (65%) No change with bronchodilator DLCO 13.20 (71%)  Impression: April Rose is a 67 year old woman with a history of hypertension, anxiety, and a lung nodule.  She is a lifelong non-smoker.  In generally good health and not symptomatic currently.   Right lower lobe lung nodule.  Has increased in size over the past 5 years.  Has become more cavitary as well.  Differential diagnosis includes infectious and inflammatory nodules as well as low-grade adenocarcinoma.  Most likely infectious or inflammatory.  Surgical resection will give him the diagnosis and treatment.  We reviewed the plan for a right VATS for wedge resection and possible superior segmentectomy depending on the results of intraoperative frozen section.  I again reviewed the nature of the procedure including the need for general anesthesia, the incisions to be used, the use of the robot, the use of drains to postoperatively, the expected hospital stay, and the overall recovery.  She and her husband understand the indications, risk, benefits, and alternatives.  They understand the risks include, but are not limited to death, MI, DVT, PE, bleeding, possible need for transfusion, infection, air leaks,  cardiac arrhythmias, as well as possibility of other unforeseeable complications.  She accepts the risks and wishes to proceed.  Plan: Robotic assisted right VATS for wedge resection and possible right lower lobe superior segmentectomy on Monday, 04/08/2023.  Loreli Slot, MD [facility] [phone]

## 2023-04-01 NOTE — H&P (View-Only) (Signed)
PCP is Wile, Laura H, MD Referring Provider is Wile, Laura H, MD  Chief Complaint  Patient presents with   Lung Lesion    Further discuss surgery    HPI: April Rose returns to discuss surgery for her right lower lobe lung nodule.  April Rose is a 66-year-old woman with a history of hypertension, anxiety, and a lung nodule on a CT scan back in 2019.  She says that she recently had a x-ray of her arm and was noted to have a lung nodule.  She had a CT of the chest which showed a 1.6 x 1.4 x 1.3 cm cavitary nodule in the superior segment of the right lower lobe.  She had had a CT of the chest back in 2019 which showed a 9 mm solid-appearing nodule.  There also was a 5 mm groundglass nodule in the left lower lobe that was unchanged from 2019.   She is a lifelong non-smoker.  No coughing, wheezing, shortness of breath, chest pain, pressure, or tightness.  No overt fevers, chills, or night sweats.  Works as an LPN.  Originally from [place].  Has been in the [place] for [place] years, but does travel back occasionally.  I saw her back in February but she wanted to wait until May for her surgery due to a trip she had planned to [place].  She she went to [place] and recently returned.  She did note some swelling in her legs after each leg of the trip but it was equal bilaterally and resolved.  No calf pain or tenderness.  Zubrod Score: At the time of surgery this patient's most appropriate activity status/level should be described as: [x]    0    Normal activity, no symptoms []    1    Restricted in physical strenuous activity but ambulatory, able to do out light work []    2    Ambulatory and capable of self care, unable to do work activities, up and about >50 % of waking hours                              []    3    Only limited self care, in bed greater than 50% of waking hours []    4    Completely disabled, no self care, confined to bed or chair []    5    Moribund  Past Medical History:  Diagnosis  Date   Anxiety    Hypertension     Past Surgical History:  Procedure Laterality Date   COLONOSCOPY     ~ 15 yrs ago    Family History  Problem Relation Age of Onset   Colon cancer Neg Hx    Colon polyps Neg Hx    Esophageal cancer Neg Hx    Stomach cancer Neg Hx    Rectal cancer Neg Hx     Social History Social History   Tobacco Use   Smoking status: Never   Smokeless tobacco: Never  Vaping Use   Vaping Use: Never used  Substance Use Topics   Alcohol use: Yes    Comment: occ wine   Drug use: Not Currently    Current Outpatient Medications  Medication Sig Dispense Refill   Bioflavonoid Products (ESTER C PO) Take by mouth. Daily     olmesartan (BENICAR) 5 MG tablet TAKE 1 TABLET BY MOUTH DAILY for 90 days       No current facility-administered medications for this visit.    Allergies  Allergen Reactions   Lisinopril Diarrhea    Review of Systems  Constitutional:  Negative for activity change, chills and fever.  HENT:  Positive for dental problem (Dentures). Negative for trouble swallowing and voice change.   Respiratory:  Negative for cough, shortness of breath and wheezing.   Cardiovascular:  Positive for leg swelling (See HPI). Negative for chest pain and palpitations.  Musculoskeletal:  Positive for arthralgias and joint swelling.  Psychiatric/Behavioral:  The patient is nervous/anxious.   All other systems reviewed and are negative.   BP (!) 149/75   Pulse 69   Resp 20   Ht 5' 2" (1.575 m)   Wt 155 lb (70.3 kg)   SpO2 98% Comment: RA  BMI 28.35 kg/m  Physical Exam Vitals reviewed.  Constitutional:      General: She is not in acute distress.    Appearance: Normal appearance.  HENT:     Head: Normocephalic and atraumatic.  Eyes:     General: No scleral icterus.    Extraocular Movements: Extraocular movements intact.  Cardiovascular:     Rate and Rhythm: Normal rate and regular rhythm.     Heart sounds: Normal heart sounds. No murmur heard.     No friction rub. No gallop.  Pulmonary:     Effort: Pulmonary effort is normal. No respiratory distress.     Breath sounds: Normal breath sounds. No wheezing or rales.  Abdominal:     General: There is no distension.     Palpations: Abdomen is soft.  Musculoskeletal:     Right lower leg: No edema.     Left lower leg: No edema.  Skin:    General: Skin is warm and dry.  Neurological:     General: No focal deficit present.     Mental Status: She is alert and oriented to person, place, and time.     Cranial Nerves: No cranial nerve deficit.     Motor: No weakness.      Diagnostic Tests: CT CHEST WITHOUT CONTRAST   TECHNIQUE: Multidetector CT imaging of the chest was performed following the standard protocol without IV contrast.   RADIATION DOSE REDUCTION: This exam was performed according to the departmental dose-optimization program which includes automated exposure control, adjustment of the mA and/or kV according to patient size and/or use of iterative reconstruction technique.   COMPARISON:  04/03/2018.   FINDINGS: Cardiovascular: Heart normal in size and configuration. No pericardial effusion. No coronary artery calcifications. Great vessels are normal caliber. No aortic atherosclerotic calcifications.   Mediastinum/Nodes: No enlarged mediastinal or axillary lymph nodes. Thyroid gland, trachea, and esophagus demonstrate no significant findings.   Lungs/Pleura: Cavitary nodule, right lower lobe, superior segment, image 35, series 8, currently measuring 1.6 cm superior to inferior by 1.4 x 1.3 cm transversely, previously 0.9 cm transversely.   5 mm ground-glass nodule, left lower lobe, superior segment, image 32, series 8, unchanged.   Remainder of the lungs is clear. No pleural effusion or pneumothorax.   Upper Abdomen: Small sliding hiatal hernia. Visualized upper abdominal structures otherwise unremarkable.   Musculoskeletal: No chest wall mass or  suspicious bone lesions identified.   IMPRESSION: 1. Previously described right lower lobe, superior segment nodule has increased in size, previously 9 mm, currently 1.6 x 1.3 x 1.4 cm. Nodule is now cavitary. This is suspicious for low-grade malignancy. Tissue sampling is recommended. 2. 5 mm ground-glass nodule, left lower lung, stable consistent   with a benign etiology 3. No acute findings in the chest. Small hiatal hernia. No other abnormalities.     Electronically Signed   By: David  Ormond M.D.   On: 12/11/2022 12:49 I again reviewed the CT images.  There is a 1.5 cm cavitary nodule in the superior segment of the right lower lobe.  Stable left lower lobe lung nodule.  Pulmonary function testing FVC 1.75 (60%) FEV1 1.44 (65%) No change with bronchodilator DLCO 13.20 (71%)  Impression: April Rose is a 66-year-old woman with a history of hypertension, anxiety, and a lung nodule.  She is a lifelong non-smoker.  In generally good health and not symptomatic currently.   Right lower lobe lung nodule.  Has increased in size over the past 5 years.  Has become more cavitary as well.  Differential diagnosis includes infectious and inflammatory nodules as well as low-grade adenocarcinoma.  Most likely infectious or inflammatory.  Surgical resection will give him the diagnosis and treatment.  We reviewed the plan for a right VATS for wedge resection and possible superior segmentectomy depending on the results of intraoperative frozen section.  I again reviewed the nature of the procedure including the need for general anesthesia, the incisions to be used, the use of the robot, the use of drains to postoperatively, the expected hospital stay, and the overall recovery.  She and her husband understand the indications, risk, benefits, and alternatives.  They understand the risks include, but are not limited to death, MI, DVT, PE, bleeding, possible need for transfusion, infection, air leaks,  cardiac arrhythmias, as well as possibility of other unforeseeable complications.  She accepts the risks and wishes to proceed.  Plan: Robotic assisted right VATS for wedge resection and possible right lower lobe superior segmentectomy on Monday, 04/08/2023.  April Heeney C Charlena Haub, MD [facility] [phone]  

## 2023-04-03 NOTE — Pre-Procedure Instructions (Signed)
Surgical Instructions    Your procedure is scheduled on Apr 08, 2023.  Report to [facility] Main Entrance "A" at 05:30 A.M., then check in with the Admitting office.  Call this number if you have problems the morning of surgery:  [phone]   If you have any questions prior to your surgery date call [phone]: Open Monday-Friday 8am-4pm If you experience any cold or flu symptoms such as cough, fever, chills, shortness of breath, etc. between now and your scheduled surgery, please notify us at the above number     Remember:  Do not eat or drink after midnight the night before your surgery    Take these medicines the morning of surgery with A SIP OF WATER:  NONE  As of today, STOP taking any Aspirin (unless otherwise instructed by your surgeon) Aleve, Naproxen, Ibuprofen, Motrin, Advil, Goody's, BC's, all herbal medications, fish oil, and all vitamins.           Do not wear jewelry or makeup. Do not wear lotions, powders, perfumes or deodorant. Do not shave 48 hours prior to surgery.  Do not bring valuables to the hospital. Do not wear nail polish, gel polish, artificial nails, or any other type of covering on natural nails (fingers and toes) If you have artificial nails or gel coating that need to be removed by a nail salon, please have this removed prior to surgery. Artificial nails or gel coating may interfere with anesthesia's ability to adequately monitor your vital signs.  [facility] is not responsible for any belongings or valuables.    Do NOT Smoke (Tobacco/Vaping)  24 hours prior to your procedure  If you use a CPAP at night, you may bring your mask for your overnight stay.   Contacts, glasses, hearing aids, dentures or partials may not be worn into surgery, please bring cases for these belongings   For patients admitted to the hospital, discharge time will be determined by your treatment team.   Patients discharged the day of surgery will not be allowed to drive  home, and someone needs to stay with them for 24 hours.   SURGICAL WAITING ROOM VISITATION Patients having surgery or a procedure may have no more than 2 support people in the waiting area - these visitors may rotate.   Children under the age of 23 must have an adult with them who is not the patient. If the patient needs to stay at the hospital during part of their recovery, the visitor guidelines for inpatient rooms apply. Pre-op nurse will coordinate an appropriate time for 1 support person to accompany patient in pre-op.  This support person may not rotate.   Please refer to [email or web address] for the visitor guidelines for Inpatients (after your surgery is over and you are in a regular room).    Special instructions:    Oral Hygiene is also important to reduce your risk of infection.  Remember - BRUSH YOUR TEETH THE MORNING OF SURGERY WITH YOUR REGULAR TOOTHPASTE   [facility]- Preparing For Surgery  Before surgery, you can play an important role. Because skin is not sterile, your skin needs to be as free of germs as possible. You can reduce the number of germs on your skin by washing with CHG (chlorahexidine gluconate) Soap before surgery.  CHG is an antiseptic cleaner which kills germs and bonds with the skin to continue killing germs even after washing.     Please do not use if you have an allergy to CHG or  antibacterial soaps. If your skin becomes reddened/irritated stop using the CHG.  Do not shave (including legs and underarms) for at least 48 hours prior to first CHG shower. It is OK to shave your face.  Please follow these instructions carefully.     Shower the NIGHT BEFORE SURGERY and the MORNING OF SURGERY with CHG Soap.   If you chose to wash your hair, wash your hair first as usual with your normal shampoo. After you shampoo, rinse your hair and body thoroughly to remove the shampoo.  Then [employer] and genitals (private  parts) with your normal soap and rinse thoroughly to remove soap.  After that Use CHG Soap as you would any other liquid soap. You can apply CHG directly to the skin and wash gently with a scrungie or a clean washcloth.   Apply the CHG Soap to your body ONLY FROM THE NECK DOWN.  Do not use on open wounds or open sores. Avoid contact with your eyes, ears, mouth and genitals (private parts). Wash Face and genitals (private parts)  with your normal soap.   Wash thoroughly, paying special attention to the area where your surgery will be performed.  Thoroughly rinse your body with warm water from the neck down.  DO NOT shower/wash with your normal soap after using and rinsing off the CHG Soap.  Pat yourself dry with a CLEAN TOWEL.  Wear CLEAN PAJAMAS to bed the night before surgery  Place CLEAN SHEETS on your bed the night before your surgery  DO NOT SLEEP WITH PETS.   Day of Surgery:  Take a shower with CHG soap. Wear Clean/Comfortable clothing the morning of surgery Do not apply any deodorants/lotions.   Remember to brush your teeth WITH YOUR REGULAR TOOTHPASTE.    If you received a COVID test during your pre-op visit, it is requested that you wear a mask when out in public, stay away from anyone that may not be feeling well, and notify your surgeon if you develop symptoms. If you have been in contact with anyone that has tested positive in the last 10 days, please notify your surgeon.    Please read over the following fact sheets that you were given.

## 2023-04-04 ENCOUNTER — Encounter (HOSPITAL_COMMUNITY)
Admission: RE | Admit: 2023-04-04 | Discharge: 2023-04-04 | Disposition: A | Discharge status: Home / Self Care | Payer: Medicare Other | Source: Ambulatory Visit | Attending: Thoracic Surgery (Cardiothoracic Vascular Surgery) | Admitting: Thoracic Surgery (Cardiothoracic Vascular Surgery)

## 2023-04-04 ENCOUNTER — Encounter (HOSPITAL_COMMUNITY): Payer: Self-pay

## 2023-04-04 ENCOUNTER — Other Ambulatory Visit: Payer: Self-pay

## 2023-04-04 VITALS — BP 125/66 | HR 80 | Temp 98.4°F | Resp 18 | Ht 62.0 in | Wt 158.5 lb

## 2023-04-04 DIAGNOSIS — Z01818 Encounter for other preprocedural examination: Secondary | ICD-10-CM

## 2023-04-04 DIAGNOSIS — R911 Solitary pulmonary nodule: Secondary | ICD-10-CM | POA: Insufficient documentation

## 2023-04-04 DIAGNOSIS — Z01812 Encounter for preprocedural laboratory examination: Secondary | ICD-10-CM | POA: Diagnosis not present

## 2023-04-04 DIAGNOSIS — Z1152 Encounter for screening for COVID-19: Secondary | ICD-10-CM | POA: Insufficient documentation

## 2023-04-04 LAB — COMPREHENSIVE METABOLIC PANEL
ALT: 18 U/L (ref 0–44)
AST: 23 U/L (ref 15–41)
Albumin: 3.9 g/dL (ref 3.5–5.0)
Alkaline Phosphatase: 59 U/L (ref 38–126)
Anion gap: 8 (ref 5–15)
BUN: 10 mg/dL (ref 8–23)
CO2: 22 mmol/L (ref 22–32)
Calcium: 9.2 mg/dL (ref 8.9–10.3)
Chloride: 108 mmol/L (ref 98–111)
Creatinine, Ser: 0.68 mg/dL (ref 0.44–1.00)
GFR, Estimated: >60 mL/min (ref >60)
Glucose, Bld: 88 mg/dL (ref 70–99)
Potassium: 3.8 mmol/L (ref 3.5–5.1)
Sodium: 138 mmol/L (ref 135–145)
Total Bilirubin: 0.7 mg/dL (ref 0.3–1.2)
Total Protein: 6.7 g/dL (ref 6.5–8.1)

## 2023-04-04 LAB — APTT: aPTT: 27 seconds (ref 24–36)

## 2023-04-04 LAB — URINALYSIS, ROUTINE W REFLEX MICROSCOPIC
Bacteria, UA: NONE SEEN
Bilirubin Urine: NEGATIVE
Glucose, UA: NEGATIVE mg/dL
Ketones, ur: NEGATIVE mg/dL
Leukocytes,Ua: NEGATIVE
Nitrite: NEGATIVE
Protein, ur: NEGATIVE mg/dL
Specific Gravity, Urine: 1.015 (ref 1.005–1.030)
pH: 5 (ref 5.0–8.0)

## 2023-04-04 LAB — TYPE AND SCREEN
ABO/RH(D): O POS
Antibody Screen: NEGATIVE

## 2023-04-04 LAB — PROTIME-INR
INR: 1 (ref 0.8–1.2)
Prothrombin Time: 13 seconds (ref 11.4–15.2)

## 2023-04-04 LAB — BLOOD GAS, ARTERIAL
Acid-Base Excess: 0.3 mmol/L (ref 0.0–2.0)
Bicarbonate: 24.6 mmol/L (ref 20.0–28.0)
O2 Saturation: 98.3 %
Patient temperature: 37
pCO2 arterial: 38 mmHg (ref 32–48)
pH, Arterial: 7.42 (ref 7.35–7.45)
pO2, Arterial: 113 mmHg — ABNORMAL HIGH (ref 83–108)

## 2023-04-04 LAB — CBC
HCT: 39.6 % (ref 36.0–46.0)
Hemoglobin: 12.3 g/dL (ref 12.0–15.0)
MCH: 25.8 pg — ABNORMAL LOW (ref 26.0–34.0)
MCHC: 31.1 g/dL (ref 30.0–36.0)
MCV: 83.2 fL (ref 80.0–100.0)
Platelets: 235 10*3/uL (ref 150–400)
RBC: 4.76 MIL/uL (ref 3.87–5.11)
RDW: 14.8 % (ref 11.5–15.5)
WBC: 6.2 10*3/uL (ref 4.0–10.5)
nRBC: 0 % (ref 0.0–0.2)

## 2023-04-04 LAB — SURGICAL PCR SCREEN
MRSA, PCR: NEGATIVE
Staphylococcus aureus: NEGATIVE

## 2023-04-04 NOTE — Progress Notes (Addendum)
PCP - Dorinda Hill Cardiologist - denies  PPM/ICD - denies   Chest x-ray - DOS EKG - 04/04/2023  Stress Test - Pt thinks she had a stress test in 2010-per patient report this was normal ECHO - denies Cardiac Cath - denies  Sleep Study - denies   Fasting Blood Sugar - N/A  Last dose of GLP1 agonist-  N/A  Blood Thinner Instructions: N/A Aspirin Instructions: N/A  ERAS Protcol - NPO order   COVID TEST- 04/04/2023    Anesthesia review: review EKG  Patient denies shortness of breath, fever, cough and chest pain at PAT appointment   All instructions explained to the patient, with a verbal understanding of the material. Patient agrees to go over the instructions while at home for a better understanding. The opportunity to ask questions was provided.

## 2023-04-05 LAB — SARS CORONAVIRUS 2 (TAT 6-24 HRS): SARS Coronavirus 2: NEGATIVE

## 2023-04-07 NOTE — Anesthesia Preprocedure Evaluation (Addendum)
Anesthesia Evaluation  Patient identified by MRN, date of birth, ID band Patient awake    Reviewed: Allergy & Precautions, NPO status , Patient's Chart, lab work & pertinent test results  History of Anesthesia Complications Negative for: history of anesthetic complications  Airway Mallampati: III  TM Distance: >3 FB Neck ROM: Full    Dental  (+) Dental Advisory Given   Pulmonary neg shortness of breath, neg sleep apnea, neg COPD, neg recent URI RLL lung nodule   Pulmonary exam normal breath sounds clear to auscultation       Cardiovascular hypertension (olmesartan), Pt. on medications (-) angina (-) Past MI, (-) Cardiac Stents and (-) CABG (-) dysrhythmias  Rhythm:Regular Rate:Normal     Neuro/Psych  PSYCHIATRIC DISORDERS Anxiety     negative neurological ROS     GI/Hepatic negative GI ROS, Neg liver ROS,,,  Endo/Other  negative endocrine ROS    Renal/GU negative Renal ROS     Musculoskeletal   Abdominal   Peds  Hematology negative hematology ROS (+)   Anesthesia Other Findings Robotic assisted right VATS for wedge resection and possible right lower lobe superior segmentectomy on Monday, 04/08/2023.  Reproductive/Obstetrics                             Anesthesia Physical Anesthesia Plan  ASA: 3  Anesthesia Plan: General   Post-op Pain Management: Tylenol PO (pre-op)*   Induction: Intravenous  PONV Risk Score and Plan: 3 and Ondansetron and Treatment may vary due to age or medical condition  Airway Management Planned: Double Lumen EBT  Additional Equipment: Arterial line  Intra-op Plan:   Post-operative Plan: Extubation in OR  Informed Consent: I have reviewed the patients History and Physical, chart, labs and discussed the procedure including the risks, benefits and alternatives for the proposed anesthesia with the patient or authorized representative who has indicated his/her  understanding and acceptance.     Dental advisory given  Plan Discussed with: CRNA and Anesthesiologist  Anesthesia Plan Comments: (Plan for 2 large-bore PIVs, arterial line, DLT --> Unable to get IV access preop. Will place central line in preop.  Risks of general anesthesia discussed including, but not limited to, sore throat, hoarse voice, chipped/damaged teeth, injury to vocal cords, nausea and vomiting, allergic reactions, lung infection, heart attack, stroke, and death. All questions answered. )       Anesthesia Quick Evaluation

## 2023-04-08 ENCOUNTER — Inpatient Hospital Stay (HOSPITAL_COMMUNITY): Payer: Medicare Other | Admitting: Anesthesiology

## 2023-04-08 ENCOUNTER — Inpatient Hospital Stay (HOSPITAL_COMMUNITY): Payer: Medicare Other

## 2023-04-08 ENCOUNTER — Other Ambulatory Visit: Payer: Self-pay

## 2023-04-08 ENCOUNTER — Encounter (HOSPITAL_COMMUNITY): Payer: Self-pay | Admitting: Thoracic Surgery (Cardiothoracic Vascular Surgery)

## 2023-04-08 ENCOUNTER — Encounter (HOSPITAL_COMMUNITY)
Admission: RE | Disposition: A | Discharge status: Home / Self Care | Payer: Self-pay | Source: Home / Self Care | Attending: Thoracic Surgery (Cardiothoracic Vascular Surgery)

## 2023-04-08 ENCOUNTER — Inpatient Hospital Stay (HOSPITAL_COMMUNITY)
Admission: RE | Admit: 2023-04-08 | Discharge: 2023-04-10 | DRG: 164 | Disposition: A | Discharge status: Home / Self Care | Payer: Medicare Other | Attending: Thoracic Surgery (Cardiothoracic Vascular Surgery) | Admitting: Thoracic Surgery (Cardiothoracic Vascular Surgery)

## 2023-04-08 ENCOUNTER — Inpatient Hospital Stay (HOSPITAL_COMMUNITY): Payer: Medicare Other | Admitting: Physician Assistant

## 2023-04-08 DIAGNOSIS — C3431 Malignant neoplasm of lower lobe, right bronchus or lung: Secondary | ICD-10-CM | POA: Diagnosis not present

## 2023-04-08 DIAGNOSIS — Z888 Allergy status to other drugs, medicaments and biological substances status: Secondary | ICD-10-CM | POA: Diagnosis not present

## 2023-04-08 DIAGNOSIS — J9811 Atelectasis: Secondary | ICD-10-CM | POA: Diagnosis not present

## 2023-04-08 DIAGNOSIS — Z79899 Other long term (current) drug therapy: Secondary | ICD-10-CM

## 2023-04-08 DIAGNOSIS — J939 Pneumothorax, unspecified: Secondary | ICD-10-CM | POA: Diagnosis not present

## 2023-04-08 DIAGNOSIS — F419 Anxiety disorder, unspecified: Secondary | ICD-10-CM | POA: Diagnosis not present

## 2023-04-08 DIAGNOSIS — I10 Essential (primary) hypertension: Secondary | ICD-10-CM

## 2023-04-08 DIAGNOSIS — Z01818 Encounter for other preprocedural examination: Secondary | ICD-10-CM | POA: Diagnosis not present

## 2023-04-08 DIAGNOSIS — R911 Solitary pulmonary nodule: Secondary | ICD-10-CM | POA: Diagnosis not present

## 2023-04-08 DIAGNOSIS — Z902 Acquired absence of lung [part of]: Principal | ICD-10-CM

## 2023-04-08 DIAGNOSIS — J90 Pleural effusion, not elsewhere classified: Secondary | ICD-10-CM | POA: Diagnosis not present

## 2023-04-08 DIAGNOSIS — C3411 Malignant neoplasm of upper lobe, right bronchus or lung: Secondary | ICD-10-CM | POA: Diagnosis not present

## 2023-04-08 HISTORY — PX: INTERCOSTAL NERVE BLOCK: SHX5021

## 2023-04-08 HISTORY — PX: LYMPH NODE DISSECTION: SHX5087

## 2023-04-08 LAB — POCT I-STAT 7, (LYTES, BLD GAS, ICA,H+H)
Acid-base deficit: 1 mmol/L (ref 0.0–2.0)
Acid-base deficit: 2 mmol/L (ref 0.0–2.0)
Bicarbonate: 22.8 mmol/L (ref 20.0–28.0)
Bicarbonate: 23 mmol/L (ref 20.0–28.0)
Calcium, Ion: 1.26 mmol/L (ref 1.15–1.40)
Calcium, Ion: 1.25 mmol/L (ref 1.15–1.40)
HCT: 35 % — ABNORMAL LOW (ref 36.0–46.0)
HCT: 35 % — ABNORMAL LOW (ref 36.0–46.0)
Hemoglobin: 11.9 g/dL — ABNORMAL LOW (ref 12.0–15.0)
Hemoglobin: 11.9 g/dL — ABNORMAL LOW (ref 12.0–15.0)
O2 Saturation: 99 %
O2 Saturation: 100 %
Potassium: 3.9 mmol/L (ref 3.5–5.1)
Potassium: 4.1 mmol/L (ref 3.5–5.1)
Sodium: 140 mmol/L (ref 135–145)
Sodium: 140 mmol/L (ref 135–145)
TCO2: 24 mmol/L (ref 22–32)
TCO2: 24 mmol/L (ref 22–32)
pCO2 arterial: 35.5 mmHg (ref 32–48)
pCO2 arterial: 37.2 mmHg (ref 32–48)
pH, Arterial: 7.416 (ref 7.35–7.45)
pH, Arterial: 7.4 (ref 7.35–7.45)
pO2, Arterial: 141 mmHg — ABNORMAL HIGH (ref 83–108)
pO2, Arterial: 507 mmHg — ABNORMAL HIGH (ref 83–108)

## 2023-04-08 LAB — ABO/RH: ABO/RH(D): O POS

## 2023-04-08 SURGERY — WEDGE RESECTION, LUNG, ROBOT-ASSISTED, THORACOSCOPIC
Anesthesia: General | Site: Chest | Laterality: Right

## 2023-04-08 MED ORDER — PANTOPRAZOLE SODIUM 40 MG PO TBEC
40.0000 mg | DELAYED_RELEASE_TABLET | Freq: Every day | ORAL | Status: DC
  Administered 2023-04-09 – 2023-04-10 (×2): 40 mg via ORAL
  Filled 2023-04-08 (×2): qty 1

## 2023-04-08 MED ORDER — OXYCODONE HCL 5 MG PO TABS: 5.0000 mg | ORAL_TABLET | Freq: Once | ORAL | Status: DC | PRN

## 2023-04-08 MED ORDER — ACETAMINOPHEN 500 MG PO TABS
1000.0000 mg | ORAL_TABLET | Freq: Once | ORAL | Status: AC
  Administered 2023-04-08: 1000 mg via ORAL
  Filled 2023-04-08: qty 2

## 2023-04-08 MED ORDER — CHLORHEXIDINE GLUCONATE CLOTH 2 % EX PADS
6.0000 | MEDICATED_PAD | Freq: Every day | CUTANEOUS | Status: DC
  Administered 2023-04-09 – 2023-04-10 (×2): 6 via TOPICAL

## 2023-04-08 MED ORDER — SENNOSIDES-DOCUSATE SODIUM 8.6-50 MG PO TABS
1.0000 | ORAL_TABLET | Freq: Every day | ORAL | Status: DC
  Administered 2023-04-08: 1 via ORAL
  Filled 2023-04-08 (×3): qty 1

## 2023-04-08 MED ORDER — MIDAZOLAM HCL 2 MG/2ML IJ SOLN
INTRAMUSCULAR | Status: DC | PRN
  Administered 2023-04-08: 2 mg via INTRAVENOUS

## 2023-04-08 MED ORDER — DEXAMETHASONE SODIUM PHOSPHATE 10 MG/ML IJ SOLN
INTRAMUSCULAR | Status: DC | PRN
  Administered 2023-04-08: 10 mg via INTRAVENOUS

## 2023-04-08 MED ORDER — FENTANYL CITRATE (PF) 250 MCG/5ML IJ SOLN
INTRAMUSCULAR | Status: DC | PRN
  Administered 2023-04-08: 50 ug via INTRAVENOUS
  Administered 2023-04-08: 100 ug via INTRAVENOUS

## 2023-04-08 MED ORDER — ROCURONIUM BROMIDE 10 MG/ML (PF) SYRINGE
PREFILLED_SYRINGE | INTRAVENOUS | Status: AC
  Filled 2023-04-08: qty 10

## 2023-04-08 MED ORDER — LIDOCAINE 2% (20 MG/ML) 5 ML SYRINGE
INTRAMUSCULAR | Status: DC | PRN
  Administered 2023-04-08: 60 mg via INTRAVENOUS

## 2023-04-08 MED ORDER — 0.9 % SODIUM CHLORIDE (POUR BTL) OPTIME
TOPICAL | Status: DC | PRN
  Administered 2023-04-08: 1000 mL

## 2023-04-08 MED ORDER — LIDOCAINE 2% (20 MG/ML) 5 ML SYRINGE
INTRAMUSCULAR | Status: AC
  Filled 2023-04-08: qty 5

## 2023-04-08 MED ORDER — ACETAMINOPHEN 160 MG/5ML PO SOLN: 1000.0000 mg | Freq: Four times a day (QID) | ORAL | Status: DC

## 2023-04-08 MED ORDER — ROCURONIUM BROMIDE 10 MG/ML (PF) SYRINGE
PREFILLED_SYRINGE | INTRAVENOUS | Status: DC | PRN
  Administered 2023-04-08: 40 mg via INTRAVENOUS
  Administered 2023-04-08 (×2): 50 mg via INTRAVENOUS

## 2023-04-08 MED ORDER — CEFAZOLIN SODIUM-DEXTROSE 2-4 GM/100ML-% IV SOLN
2.0000 g | INTRAVENOUS | Status: AC
  Administered 2023-04-08: 2 g via INTRAVENOUS
  Filled 2023-04-08: qty 100

## 2023-04-08 MED ORDER — TRAMADOL HCL 50 MG PO TABS
50.0000 mg | ORAL_TABLET | Freq: Four times a day (QID) | ORAL | Status: DC | PRN
  Administered 2023-04-08: 50 mg via ORAL
  Filled 2023-04-08: qty 2
  Filled 2023-04-08: qty 1

## 2023-04-08 MED ORDER — CHLORHEXIDINE GLUCONATE 0.12 % MT SOLN
15.0000 mL | OROMUCOSAL | Status: AC
  Administered 2023-04-08: 15 mL via OROMUCOSAL
  Filled 2023-04-08 (×2): qty 15

## 2023-04-08 MED ORDER — INDOCYANINE GREEN 25 MG IV SOLR
INTRAVENOUS | Status: AC
  Filled 2023-04-08: qty 10

## 2023-04-08 MED ORDER — GABAPENTIN 300 MG PO CAPS: 300.0000 mg | ORAL_CAPSULE | Freq: Two times a day (BID) | ORAL | Status: DC

## 2023-04-08 MED ORDER — PHENYLEPHRINE HCL-NACL 20-0.9 MG/250ML-% IV SOLN
INTRAVENOUS | Status: DC | PRN
  Administered 2023-04-08: 30 ug/min via INTRAVENOUS

## 2023-04-08 MED ORDER — PROPOFOL 10 MG/ML IV BOLUS
INTRAVENOUS | Status: AC
  Filled 2023-04-08: qty 20

## 2023-04-08 MED ORDER — CEFAZOLIN SODIUM-DEXTROSE 2-4 GM/100ML-% IV SOLN
2.0000 g | Freq: Three times a day (TID) | INTRAVENOUS | Status: AC
  Administered 2023-04-08 (×2): 2 g via INTRAVENOUS
  Filled 2023-04-08 (×2): qty 100

## 2023-04-08 MED ORDER — ACETAMINOPHEN 10 MG/ML IV SOLN
INTRAVENOUS | Status: AC
  Filled 2023-04-08: qty 100

## 2023-04-08 MED ORDER — OXYCODONE HCL 5 MG/5ML PO SOLN: 5.0000 mg | Freq: Once | ORAL | Status: DC | PRN

## 2023-04-08 MED ORDER — BUPIVACAINE LIPOSOME 1.3 % IJ SUSP
INTRAMUSCULAR | Status: DC | PRN
  Administered 2023-04-08: 100 mL

## 2023-04-08 MED ORDER — ONDANSETRON HCL 4 MG/2ML IJ SOLN: 4.0000 mg | Freq: Four times a day (QID) | INTRAMUSCULAR | Status: DC | PRN

## 2023-04-08 MED ORDER — DEXAMETHASONE SODIUM PHOSPHATE 10 MG/ML IJ SOLN
INTRAMUSCULAR | Status: AC
  Filled 2023-04-08: qty 1

## 2023-04-08 MED ORDER — BUPIVACAINE LIPOSOME 1.3 % IJ SUSP
INTRAMUSCULAR | Status: AC
  Filled 2023-04-08: qty 20

## 2023-04-08 MED ORDER — ONDANSETRON HCL 4 MG/2ML IJ SOLN
INTRAMUSCULAR | Status: AC
  Filled 2023-04-08: qty 2

## 2023-04-08 MED ORDER — BUPIVACAINE HCL (PF) 0.5 % IJ SOLN
INTRAMUSCULAR | Status: AC
  Filled 2023-04-08: qty 30

## 2023-04-08 MED ORDER — PHENYLEPHRINE 80 MCG/ML (10ML) SYRINGE FOR IV PUSH (FOR BLOOD PRESSURE SUPPORT)
PREFILLED_SYRINGE | INTRAVENOUS | Status: AC
  Filled 2023-04-08: qty 10

## 2023-04-08 MED ORDER — FENTANYL CITRATE (PF) 250 MCG/5ML IJ SOLN
INTRAMUSCULAR | Status: AC
  Filled 2023-04-08: qty 5

## 2023-04-08 MED ORDER — IRBESARTAN 75 MG PO TABS
37.5000 mg | ORAL_TABLET | Freq: Every day | ORAL | Status: DC
  Administered 2023-04-09 – 2023-04-10 (×2): 37.5 mg via ORAL
  Filled 2023-04-08 (×2): qty 0.5

## 2023-04-08 MED ORDER — HEMOSTATIC AGENTS (NO CHARGE) OPTIME
TOPICAL | Status: DC | PRN
  Administered 2023-04-08: 1 via TOPICAL

## 2023-04-08 MED ORDER — ORAL CARE MOUTH RINSE: 15.0000 mL | OROMUCOSAL | Status: DC | PRN

## 2023-04-08 MED ORDER — GABAPENTIN 300 MG PO CAPS
300.0000 mg | ORAL_CAPSULE | Freq: Every day | ORAL | Status: DC
  Administered 2023-04-08 – 2023-04-09 (×2): 300 mg via ORAL
  Filled 2023-04-08 (×3): qty 1

## 2023-04-08 MED ORDER — FENTANYL CITRATE (PF) 100 MCG/2ML IJ SOLN: 25.0000 ug | INTRAMUSCULAR | Status: DC | PRN

## 2023-04-08 MED ORDER — ENOXAPARIN SODIUM 40 MG/0.4ML IJ SOSY
40.0000 mg | PREFILLED_SYRINGE | Freq: Every day | INTRAMUSCULAR | Status: DC
  Administered 2023-04-09: 40 mg via SUBCUTANEOUS
  Filled 2023-04-08 (×2): qty 0.4

## 2023-04-08 MED ORDER — PHENYLEPHRINE 80 MCG/ML (10ML) SYRINGE FOR IV PUSH (FOR BLOOD PRESSURE SUPPORT)
PREFILLED_SYRINGE | INTRAVENOUS | Status: DC | PRN
  Administered 2023-04-08 (×4): 80 ug via INTRAVENOUS

## 2023-04-08 MED ORDER — CHLORHEXIDINE GLUCONATE CLOTH 2 % EX PADS
6.0000 | MEDICATED_PAD | Freq: Once | CUTANEOUS | Status: AC
  Administered 2023-04-08: 6 via TOPICAL

## 2023-04-08 MED ORDER — MORPHINE SULFATE (PF) 2 MG/ML IV SOLN: 2.0000 mg | INTRAVENOUS | Status: DC | PRN

## 2023-04-08 MED ORDER — MIDAZOLAM HCL 2 MG/2ML IJ SOLN
INTRAMUSCULAR | Status: AC
  Filled 2023-04-08: qty 2

## 2023-04-08 MED ORDER — ONDANSETRON HCL 4 MG/2ML IJ SOLN
INTRAMUSCULAR | Status: DC | PRN
  Administered 2023-04-08: 4 mg via INTRAVENOUS

## 2023-04-08 MED ORDER — ACETAMINOPHEN 500 MG PO TABS
1000.0000 mg | ORAL_TABLET | Freq: Four times a day (QID) | ORAL | Status: DC
  Administered 2023-04-08 – 2023-04-10 (×5): 1000 mg via ORAL
  Filled 2023-04-08 (×7): qty 2

## 2023-04-08 MED ORDER — SODIUM CHLORIDE 0.45 % IV SOLN: INTRAVENOUS | Status: DC

## 2023-04-08 MED ORDER — LACTATED RINGERS IV SOLN: INTRAVENOUS | Status: DC | PRN

## 2023-04-08 MED ORDER — SUGAMMADEX SODIUM 200 MG/2ML IV SOLN
INTRAVENOUS | Status: DC | PRN
  Administered 2023-04-08: 200 mg via INTRAVENOUS

## 2023-04-08 MED ORDER — OXYCODONE HCL 5 MG PO TABS: 5.0000 mg | ORAL_TABLET | ORAL | Status: DC | PRN

## 2023-04-08 MED ORDER — BISACODYL 5 MG PO TBEC
10.0000 mg | DELAYED_RELEASE_TABLET | Freq: Every day | ORAL | Status: DC
  Administered 2023-04-08 – 2023-04-09 (×2): 10 mg via ORAL
  Filled 2023-04-08 (×3): qty 2

## 2023-04-08 MED ORDER — INDOCYANINE GREEN 25 MG IV SOLR
INTRAVENOUS | Status: DC | PRN
  Administered 2023-04-08: 2.5 mg via INTRAVENOUS

## 2023-04-08 MED ORDER — PROPOFOL 10 MG/ML IV BOLUS
INTRAVENOUS | Status: DC | PRN
  Administered 2023-04-08: 30 mg via INTRAVENOUS
  Administered 2023-04-08: 20 mg via INTRAVENOUS
  Administered 2023-04-08: 150 mg via INTRAVENOUS

## 2023-04-08 MED ORDER — AMISULPRIDE (ANTIEMETIC) 5 MG/2ML IV SOLN: 10.0000 mg | Freq: Once | INTRAVENOUS | Status: DC | PRN

## 2023-04-08 MED ORDER — SODIUM CHLORIDE 0.9 % IR SOLN
Status: DC | PRN
  Administered 2023-04-08: 1000 mL

## 2023-04-08 MED ORDER — ACETAMINOPHEN 10 MG/ML IV SOLN
1000.0000 mg | Freq: Once | INTRAVENOUS | Status: AC
  Administered 2023-04-08: 1000 mg via INTRAVENOUS

## 2023-04-08 SURGICAL SUPPLY — 90 items
ADH SKN CLS APL DERMABOND .7 (GAUZE/BANDAGES/DRESSINGS) ×1
APPLIER CLIP ROT 10 11.4 M/L (STAPLE)
APR CLP MED LRG 11.4X10 (STAPLE)
BAG SPEC RTRVL C125 8X14 (MISCELLANEOUS) ×1
BLADE CLIPPER SURG (BLADE) ×1 IMPLANT
CANISTER SUCT 3000ML PPV (MISCELLANEOUS) ×2 IMPLANT
CANNULA REDUCER 12-8 DVNC XI (CANNULA) ×2 IMPLANT
CLIP APPLIE ROT 10 11.4 M/L (STAPLE) IMPLANT
CNTNR URN SCR LID CUP LEK RST (MISCELLANEOUS) ×5 IMPLANT
CONN ST 1/4X3/8  BEN (MISCELLANEOUS)
CONN ST 1/4X3/8 BEN (MISCELLANEOUS) IMPLANT
CONT SPEC 4OZ STRL OR WHT (MISCELLANEOUS) ×16
DEFOGGER SCOPE WARMER CLEARIFY (MISCELLANEOUS) ×1 IMPLANT
DERMABOND ADVANCED .7 DNX12 (GAUZE/BANDAGES/DRESSINGS) ×1 IMPLANT
DRAIN CHANNEL 28F RND 3/8 FF (WOUND CARE) IMPLANT
DRAIN CHANNEL 32F RND 10.7 FF (WOUND CARE) IMPLANT
DRAPE ARM DVNC X/XI (DISPOSABLE) ×4 IMPLANT
DRAPE COLUMN DVNC XI (DISPOSABLE) ×1 IMPLANT
DRAPE CV SPLIT W-CLR ANES SCRN (DRAPES) ×1 IMPLANT
DRAPE HALF SHEET 40X57 (DRAPES) ×1 IMPLANT
DRAPE INCISE IOBAN 66X45 STRL (DRAPES) IMPLANT
DRAPE ORTHO SPLIT 77X108 STRL (DRAPES) ×1
DRAPE SURG ORHT 6 SPLT 77X108 (DRAPES) ×1 IMPLANT
ELECT BLADE 6.5 EXT (BLADE) IMPLANT
ELECT REM PT RETURN 9FT ADLT (ELECTROSURGICAL) ×1
ELECTRODE REM PT RTRN 9FT ADLT (ELECTROSURGICAL) ×1 IMPLANT
FORCEPS BPLR FENES DVNC XI (FORCEP) IMPLANT
FORCEPS BPLR LNG DVNC XI (INSTRUMENTS) IMPLANT
GAUZE KITTNER 4X5 RF (MISCELLANEOUS) ×2 IMPLANT
GAUZE SPONGE 4X4 12PLY STRL (GAUZE/BANDAGES/DRESSINGS) ×1 IMPLANT
GLOVE SS BIOGEL STRL SZ 7.5 (GLOVE) ×1 IMPLANT
GOWN STRL REUS W/ TWL LRG LVL3 (GOWN DISPOSABLE) ×2 IMPLANT
GOWN STRL REUS W/ TWL XL LVL3 (GOWN DISPOSABLE) ×2 IMPLANT
GOWN STRL REUS W/TWL 2XL LVL3 (GOWN DISPOSABLE) ×1 IMPLANT
GOWN STRL REUS W/TWL LRG LVL3 (GOWN DISPOSABLE) ×2
GOWN STRL REUS W/TWL XL LVL3 (GOWN DISPOSABLE) ×2
GRASPER TIP-UP FEN DVNC XI (INSTRUMENTS) IMPLANT
HEMOSTAT SURGICEL 2X14 (HEMOSTASIS) ×3 IMPLANT
IRRIGATION STRYKERFLOW (MISCELLANEOUS) ×1 IMPLANT
IRRIGATOR STRYKERFLOW (MISCELLANEOUS) ×1
KIT BASIN OR (CUSTOM PROCEDURE TRAY) ×1 IMPLANT
KIT SUCTION CATH 14FR (SUCTIONS) IMPLANT
KIT TURNOVER KIT B (KITS) ×1 IMPLANT
NDL HYPO 25GX1X1/2 BEV (NEEDLE) ×1 IMPLANT
NDL SPNL 22GX3.5 QUINCKE BK (NEEDLE) ×1 IMPLANT
NEEDLE HYPO 25GX1X1/2 BEV (NEEDLE) ×1 IMPLANT
NEEDLE SPNL 22GX3.5 QUINCKE BK (NEEDLE) ×1 IMPLANT
NS IRRIG 1000ML POUR BTL (IV SOLUTION) ×1 IMPLANT
PACK CHEST (CUSTOM PROCEDURE TRAY) ×1 IMPLANT
PAD ARMBOARD 7.5X6 YLW CONV (MISCELLANEOUS) ×2 IMPLANT
PORT ACCESS TROCAR AIRSEAL 12 (TROCAR) ×1 IMPLANT
RELOAD STAPLE 45 2.5 WHT DVNC (STAPLE) IMPLANT
RELOAD STAPLE 45 3.5 BLU DVNC (STAPLE) IMPLANT
RELOAD STAPLE 45 4.3 GRN DVNC (STAPLE) IMPLANT
RELOAD STAPLER 2.5X45 WHT DVNC (STAPLE) ×1 IMPLANT
RELOAD STAPLER 3.5X45 BLU DVNC (STAPLE) ×5 IMPLANT
RELOAD STAPLER 4.3X45 GRN DVNC (STAPLE) ×3 IMPLANT
SCISSORS LAP 5X35 DISP (ENDOMECHANICALS) IMPLANT
SEAL CANN UNIV 5-8 DVNC XI (MISCELLANEOUS) ×2 IMPLANT
SET TRI-LUMEN FLTR TB AIRSEAL (TUBING) ×1 IMPLANT
SOL ELECTROSURG ANTI STICK (MISCELLANEOUS) ×1
SOLUTION ELECTROSURG ANTI STCK (MISCELLANEOUS) ×1 IMPLANT
SPONGE TONSIL 1 RF SGL (DISPOSABLE) IMPLANT
STAPLER 45 SUREFORM CVD DVNC (STAPLE) IMPLANT
STAPLER CANNULA SEAL DVNC XI (STAPLE) ×2 IMPLANT
STAPLER RELOAD 2.5X45 WHT DVNC (STAPLE) ×1
STAPLER RELOAD 3.5X45 BLU DVNC (STAPLE) ×5
STAPLER RELOAD 4.3X45 GRN DVNC (STAPLE) ×3
SUT PDS AB 3-0 SH 27 (SUTURE) IMPLANT
SUT PROLENE 4 0 RB 1 (SUTURE)
SUT PROLENE 4-0 RB1 .5 CRCL 36 (SUTURE) IMPLANT
SUT SILK  1 MH (SUTURE) ×2
SUT SILK 1 MH (SUTURE) ×2 IMPLANT
SUT SILK 2 0 SH (SUTURE) IMPLANT
SUT SILK 2 0SH CR/8 30 (SUTURE) IMPLANT
SUT SILK 3 0SH CR/8 30 (SUTURE) IMPLANT
SUT VIC AB 1 CTX 36 (SUTURE)
SUT VIC AB 1 CTX36XBRD ANBCTR (SUTURE) IMPLANT
SUT VIC AB 2-0 CTX 36 (SUTURE) IMPLANT
SUT VIC AB 3-0 X1 27 (SUTURE) ×2 IMPLANT
SUT VICRYL 0 TIES 12 18 (SUTURE) ×1 IMPLANT
SUT VICRYL 0 UR6 27IN ABS (SUTURE) ×2 IMPLANT
SYR 20ML LL LF (SYRINGE) ×2 IMPLANT
SYSTEM RETRIEVAL ANCHOR 8 (MISCELLANEOUS) IMPLANT
SYSTEM SAHARA CHEST DRAIN ATS (WOUND CARE) ×1 IMPLANT
TAPE CLOTH 4X10 WHT NS (GAUZE/BANDAGES/DRESSINGS) ×1 IMPLANT
TIP APPLICATOR SPRAY EXTEND 16 (VASCULAR PRODUCTS) IMPLANT
TOWEL GREEN STERILE (TOWEL DISPOSABLE) ×2 IMPLANT
TRAY FOLEY MTR SLVR 16FR STAT (SET/KITS/TRAYS/PACK) ×1 IMPLANT
WATER STERILE IRR 1000ML POUR (IV SOLUTION) ×1 IMPLANT

## 2023-04-08 NOTE — Transfer of Care (Signed)
Immediate Anesthesia Transfer of Care Note  Patient: April Rose  Procedure(s) Performed: XI ROBOTIC ASSISTED THORACOSCOPY-RIGHT LOWER LOBE WEDGE SEGMENTECTOMY (Right: Chest) INTERCOSTAL NERVE BLOCK (Right: Chest) LYMPH NODE DISSECTION (Right: Chest)  Patient Location: PACU  Anesthesia Type:General  Level of Consciousness: awake and alert   Airway & Oxygen Therapy: Patient Spontanous Breathing and Patient connected to face mask oxygen  Post-op Assessment: Report given to RN and Post -op Vital signs reviewed and stable  Post vital signs: Reviewed and stable  Last Vitals:  Vitals Value Taken Time  BP 140/69 04/08/23 1053  Temp 36.6 C 04/08/23 1053  Pulse 74 04/08/23 1056  Resp 20 04/08/23 1056  SpO2 95 % 04/08/23 1056  Vitals shown include unvalidated device data.  Last Pain:  Vitals:   04/08/23 0635  PainSc: 0-No pain      Patients Stated Pain Goal: 0 (04/08/23 1610)  Complications: No notable events documented.

## 2023-04-08 NOTE — Anesthesia Procedure Notes (Signed)
Procedure Name: Intubation Date/Time: 04/08/2023 8:56 AM  Performed by: Katina Degree, CRNAPre-anesthesia Checklist: Patient identified, Emergency Drugs available, Suction available and Patient being monitored Patient Re-evaluated:Patient Re-evaluated prior to induction Oxygen Delivery Method: Circle system utilized Preoxygenation: Pre-oxygenation with 100% oxygen Induction Type: IV induction Ventilation: Mask ventilation without difficulty Laryngoscope Size: Mac and 3 Grade View: Grade I Tube type: Oral Endobronchial tube: Left, EBT position confirmed by auscultation, EBT position confirmed by fiberoptic bronchoscope and Double lumen EBT and 37 Fr Number of attempts: 1 Airway Equipment and Method: Stylet and Oral airway Placement Confirmation: ETT inserted through vocal cords under direct vision, positive ETCO2 and breath sounds checked- equal and bilateral Tube secured with: Tape Dental Injury: Teeth and Oropharynx as per pre-operative assessment  Comments: First attempt unsuccessful witih 37 DLT, grade 1 view. Second attempt by [facility] successful with 35 DLT.

## 2023-04-08 NOTE — Interval H&P Note (Signed)
History and Physical Interval Note:  04/08/2023 7:18 AM  April Rose  has presented today for surgery, with the diagnosis of RLL LUNG NODULE.  The various methods of treatment have been discussed with the patient and family. After consideration of risks, benefits and other options for treatment, the patient has consented to  Procedure(s): XI ROBOTIC ASSISTED THORACOSCOPY-RIGHT LOWER LOBE WEDGE RESECTION, possible segmentectomy (Right) as a surgical intervention.  The patient's history has been reviewed, patient examined, no change in status, stable for surgery.  I have reviewed the patient's chart and labs.  Questions were answered to the patient's satisfaction.     Loreli Slot

## 2023-04-08 NOTE — Anesthesia Procedure Notes (Signed)
Central Venous Catheter Insertion Performed by: Linton Rump, MD, anesthesiologist Start/End5/05/2023 7:20 AM, 04/08/2023 7:30 AM Patient location: Pre-op. Preanesthetic checklist: patient identified, site marked, risks and benefits discussed, surgical consent, monitors and equipment checked, pre-op evaluation, timeout performed and anesthesia consent Position: Trendelenburg Lidocaine 1% used for infiltration Hand hygiene performed , maximum sterile barriers used  and Seldinger technique used Catheter size: 8 Fr Total catheter length 16. Central line was placed.Double lumen Procedure performed using ultrasound guided technique. Ultrasound Notes:anatomy identified, needle tip was noted to be adjacent to the nerve/plexus identified, no ultrasound evidence of intravascular and/or intraneural injection and image(s) printed for medical record Attempts: 1 Following insertion, dressing applied, line sutured and Biopatch. Post procedure assessment: blood return through all ports and free fluid flow  Patient tolerated the procedure well with no immediate complications.

## 2023-04-08 NOTE — Anesthesia Postprocedure Evaluation (Signed)
Anesthesia Post Note  Patient: Industrial/product designer  Procedure(s) Performed: XI ROBOTIC ASSISTED THORACOSCOPY-RIGHT LOWER LOBE WEDGE SEGMENTECTOMY (Right: Chest) INTERCOSTAL NERVE BLOCK (Right: Chest) LYMPH NODE DISSECTION (Right: Chest)     Patient location during evaluation: PACU Anesthesia Type: General Level of consciousness: awake Pain management: pain level controlled Vital Signs Assessment: post-procedure vital signs reviewed and stable Respiratory status: spontaneous breathing, nonlabored ventilation and respiratory function stable Cardiovascular status: blood pressure returned to baseline and stable Postop Assessment: no apparent nausea or vomiting Anesthetic complications: no   No notable events documented.  Last Vitals:  Vitals:   04/08/23 1215 04/08/23 1315  BP: 139/70 (!) 145/72  Pulse: 74 79  Resp: 17 15  Temp: 36.7 C   SpO2: 96% 93%    Last Pain:  Vitals:   04/08/23 1053  PainSc: 0-No pain                 Linton Rump

## 2023-04-08 NOTE — Hospital Course (Signed)
History of Present Illness:  April Rose is a 67 year old woman with a history of hypertension, anxiety, and a lung nodule on a CT scan back in 2019.  She says that she recently had a x-ray of her arm and was noted to have a lung nodule.  She had a CT of the chest which showed a 1.6 x 1.4 x 1.3 cm cavitary nodule in the superior segment of the right lower lobe.  She had had a CT of the chest back in 2019 which showed a 9 mm solid-appearing nodule.  There also was a 5 mm groundglass nodule in the left lower lobe that was unchanged from 2019.   She is a lifelong non-smoker.  No coughing, wheezing, shortness of breath, chest pain, pressure, or tightness.  No overt fevers, chills, or night sweats.  Works as an Public house manager.  Originally from [place].  Has been in the [place] for 30 years, but does travel back occasionally.   She was evaluated by Dr .Dorris Fetch in in February but she wanted to wait until May for her surgery due to a trip she had planned to [place].  She she went to [place] and recently returned.  She did note some swelling in her legs after each leg of the trip but it was equal bilaterally and resolved.  No calf pain or tenderness.  He recommended Robotic Assisted Resection of the lung mass with possible segmentectomy should the mass be a cancer.  The risks and benefits of the procedure were explained to the patient and she was agreeable to proceed.  Hospital Course:  Keyon Encalada presented to [facility] on 04/08/2023.  She was taken to the operating room and underwent Robotic Assisted Right Video Assisted Thoracoscopy with wedge resection RLL, Right Lower Superior Segmentectomy, Lymph Node Dissection, and Intercostal nerve block.  She tolerated the procedure without difficulty, was extubated, and taken to the PACU in stable condition.

## 2023-04-08 NOTE — Anesthesia Procedure Notes (Signed)
Arterial Line Insertion Start/End5/05/2023 7:05 AM, 04/08/2023 7:10 AM Performed by: CRNA  Patient location: Pre-op. Preanesthetic checklist: patient identified, IV checked, site marked, risks and benefits discussed, surgical consent, monitors and equipment checked, pre-op evaluation, timeout performed and anesthesia consent Lidocaine 1% used for infiltration Left, radial was placed Catheter size: 20 G Hand hygiene performed  and maximum sterile barriers used   Attempts: 1 Procedure performed without using ultrasound guided technique. Following insertion, dressing applied and Biopatch. Post procedure assessment: normal and unchanged  Additional procedure comments: Placed by SRNA.

## 2023-04-08 NOTE — Brief Op Note (Signed)
04/08/2023  10:28 AM  PATIENT:  April Rose  67 y.o. female  PRE-OPERATIVE DIAGNOSIS:  RIGHT LOWER LOBE LUNG NODULE  POST-OPERATIVE DIAGNOSIS:  RIGHT LOWER LOBE LUNG NODULE  PROCEDURE:  Procedure(s):  XI ROBOTIC ASSISTED THORACOSCOPY  RIGHT LOWER LOBE WEDGE SEGMENTECTOMY (Right)  INTERCOSTAL NERVE BLOCK (Right)  LYMPH NODE DISSECTION (Right)  SURGEON:  Surgeon(s) and Role:    * Loreli Slot, MD - Primary  PHYSICIAN ASSISTANT: Lowella Dandy PA-C  ASSISTANTS: none   ANESTHESIA:   general  EBL:  50 mL   BLOOD ADMINISTERED:none  DRAINS:  28 Blake Drain    LOCAL MEDICATIONS USED:  BUPIVICAINE   SPECIMEN:  Source of Specimen:  Right Lower Lobe Superior Segmentectomy  DISPOSITION OF SPECIMEN:  PATHOLOGY  COUNTS:  YES  TOURNIQUET:  * No tourniquets in log *  DICTATION: .Dragon Dictation  PLAN OF CARE: Admit to inpatient   PATIENT DISPOSITION:  PACU - hemodynamically stable.   Delay start of Pharmacological VTE agent (>24hrs) due to surgical blood loss or risk of bleeding: no

## 2023-04-09 ENCOUNTER — Inpatient Hospital Stay (HOSPITAL_COMMUNITY): Payer: Medicare Other

## 2023-04-09 ENCOUNTER — Encounter (HOSPITAL_COMMUNITY): Payer: Self-pay | Admitting: Thoracic Surgery (Cardiothoracic Vascular Surgery)

## 2023-04-09 LAB — CBC
HCT: 36.7 % (ref 36.0–46.0)
Hemoglobin: 11.6 g/dL — ABNORMAL LOW (ref 12.0–15.0)
MCH: 25.8 pg — ABNORMAL LOW (ref 26.0–34.0)
MCHC: 31.6 g/dL (ref 30.0–36.0)
MCV: 81.7 fL (ref 80.0–100.0)
Platelets: 198 10*3/uL (ref 150–400)
RBC: 4.49 MIL/uL (ref 3.87–5.11)
RDW: 14.8 % (ref 11.5–15.5)
WBC: 9.6 10*3/uL (ref 4.0–10.5)
nRBC: 0 % (ref 0.0–0.2)

## 2023-04-09 LAB — BASIC METABOLIC PANEL
Anion gap: 6 (ref 5–15)
BUN: 5 mg/dL — ABNORMAL LOW (ref 8–23)
CO2: 24 mmol/L (ref 22–32)
Calcium: 8.9 mg/dL (ref 8.9–10.3)
Chloride: 106 mmol/L (ref 98–111)
Creatinine, Ser: 0.64 mg/dL (ref 0.44–1.00)
GFR, Estimated: >60 mL/min (ref >60)
Glucose, Bld: 103 mg/dL — ABNORMAL HIGH (ref 70–99)
Potassium: 3.7 mmol/L (ref 3.5–5.1)
Sodium: 136 mmol/L (ref 135–145)

## 2023-04-09 LAB — SURGICAL PATHOLOGY

## 2023-04-09 NOTE — Op Note (Signed)
NAMESELIMA, GUTHIER MEDICAL RECORD NO: [id number] ACCOUNT NO: [id number] DATE OF BIRTH: 09/28/1956 FACILITY: [facility] LOCATION: [facility] PHYSICIAN: Salvatore Decent. Dorris Fetch, MD  Operative Report   DATE OF PROCEDURE: 04/08/2023  PREOPERATIVE DIAGNOSIS:  Right lower lobe lung nodule.  POSTOPERATIVE DIAGNOSIS:  Adenocarcinoma, right lower lobe, clinical stage IB (T2a N0).  PROCEDURE:   Xi robotic-assisted right lower lobe superior segmentectomy,  Lymph node dissection and  Intercostal nerve blocks levels 3 through 10.  SURGEON:  Salvatore Decent. Dorris Fetch, MD  ASSISTANT:  Lowella Dandy, PA  ANESTHESIA:  General.  FINDINGS:  Nodule clearly visible on visceral pleural surface with likely involvement of the visceral pleural surface.  Frozen section revealed adenocarcinoma.  Bronchial margin negative for tumor.  Normal-appearing lymph nodes.  CLINICAL NOTE:  April Rose is a 67 year old woman who is a lifelong nonsmoker.  She was found to have a lung nodule on a CT in 2019.  Over time that nodule had increased in size when it was seen on a recent chest x-ray of her arm.  CT showed a 1.6 cm cavitary nodule in the superior segment of the right lower lobe.  She was advised to undergo surgical resection for definitive diagnosis and treatment.  The indications, risks, benefits, and alternatives were discussed in detail with the patient.  She understood and accepted the risks and agreed to proceed.  OPERATIVE NOTE:  April Rose was brought to the preoperative holding area on 04/08/2023.  Anesthesia placed a central venous catheter and an arterial blood pressure monitoring line.  She was taken to the operating room and anesthetized and intubated.  A Foley catheter was placed.  Intravenous antibiotics were administered.  Sequential compression devices were placed on the calves for DVT prophylaxis.  She was placed in a left lateral decubitus position.  A Bair Hugger was placed for active warming.  The right chest  was prepped and draped in the usual sterile fashion.  Single lung ventilation of the left lung was initiated.  A timeout was performed.  A solution containing 20 mL of liposomal bupivacaine, 30 mL of 0.5% bupivacaine and 50 mL of saline was prepared.  This solution was used for local at the incision sites as well as for the intercostal nerve blocks.  An incision was made in the eighth interspace in approximately the midaxillary line, and an 8 mm robotic port was inserted.  The thoracoscope was advanced into the chest. After confirming intrapleural placement, carbon dioxide was insufflated per protocol.  A 12 mm robotic port was placed in the eighth interspace anterior to the camera port and then a 12 mm AirSeal port was placed in the tenth interspace anteriorly.  Intercostal nerve blocks then were performed from the third to the tenth interspace injecting 10 mL of the bupivacaine solution into a subpleural plane at each level.  Two additional eighth interspace robotic ports were placed, the robot was deployed.  The camera arm was docked, targeting was performed.  The remaining arms were docked.  The robotic instruments were inserted with thoracoscopic visualization.  Inspection of the lung showed the nodule clearly visible along the lateral edge of the superior segment. There was invagination of the visceral pleura.  The parietal pleura was normal.  There was no pleural effusion.  A wedge resection was performed with sequential firings of the robotic stapler using blue and green cartridges.  The specimen was placed into an endoscopic retrieval bag and removed.  Inspection of the specimen showed there was at least a cm  gross margin.  A small piece of the specimen was removed in case AFB and fungal cultures were necessary.  The remainder was sent for frozen section that subsequently returned showing adenocarcinoma.  The inferior pulmonary ligament was divided.  No level 9 node was identified.  The pleural  reflection was divided at the hilum posteriorly and level 7 and level 8 nodes were removed.  The space between the bifurcation of the upper lobe bronchus from the bronchus intermedius was explored. Two lymph nodes were found, one was relatively small and the other was relatively large, but both were benign in appearance.  These were the level 11 nodes.  Inspection along the mainstem bronchus did not reveal a level 10 node.  The pleura was incised superior to the azygos vein and level 4R nodes were removed.  These also were normal in appearance.  Attention then was turned to the fissure, which was nearly complete The pulmonary artery was identified.  The pleura overlying it was incised and the plane was developed superficial to the superior segmental pulmonary artery.  The major fissure was completed posteriorly with a single firing of the robotic stapler. Level 12 and 13 nodes were dissected out and the  superior segmental PA branch was encircled and divided with the robotic stapler. After removing the level 13 node, the superior segmental bronchus was encircled. The stapler was placed across the superior segmental bronchus and closed.  A test inflation showed aeration of the middle lobe and the remainder of the lower lobe and the stapler was fired transecting the superior segmental bronchus.  Intravenous ICG was administered and the demarcation between the nonperfused superior segment and the perfused basilar segments was scored with cautery.  The segmentectomy then was completed with sequential firings of the robotic stapler, making sure to remove the entire superior segment.  The specimen was placed into an endoscopic retrieval bag, removed, and sent for frozen section of the bronchial margin, which subsequently returned with no tumor seen.  Chest was copiously irrigated with saline.  The vessel loop and sponges that had been used during the dissection were removed.  The chest was irrigated with saline.  A test  inflation at 30 cm of water revealed no evidence of an air leak.  The robotic instruments were removed and the robot was undocked.  Final inspection was made of the port sites.  There were no signs of bleeding.  A 28-French Blake drain was placed through the anterior eighth interspace incision and secured with #1 silk suture. It was directed to the apex.  The remaining incisions were closed in standard fashion.  The chest tube was placed to a Pleur-Evac on waterseal.  The patient was extubated in the operating room and taken to the Postanesthetic Care Unit in good condition.  All sponge, needle and instrument counts were correct at the end of the procedure.  Experienced assistance was necessary for this case due to surgical complexity.  Lowella Dandy, PA, served as the first assistant providing assistance with port placement, robot docking and undocking, instrument exchange, specimen retrieval, suctioning, and wound closure.      PAA D: 04/08/2023 4:25:27 pm T: 04/09/2023 12:16:00 am  JOB: [id number]/ [id number]

## 2023-04-09 NOTE — Progress Notes (Addendum)
      [address]       [name] [address]             [phone]      1 Day Post-Op Procedure(s) (LRB): XI ROBOTIC ASSISTED THORACOSCOPY-RIGHT LOWER LOBE WEDGE SEGMENTECTOMY (Right) INTERCOSTAL NERVE BLOCK (Right) LYMPH NODE DISSECTION (Right) Subjective: Sitting up in bed, says pain is well controlled.  Ate breakfast. No new concerns.   Objective: Vital signs in last 24 hours: Temp:  [97.9 F (36.6 C)-98.6 F (37 C)] 98.2 F (36.8 C) (05/07 0740) Pulse Rate:  [65-94] 84 (05/07 0740) Cardiac Rhythm: Normal sinus rhythm (05/07 0700) Resp:  [14-21] 20 (05/07 0740) BP: (118-153)/(60-80) 140/70 (05/07 0740) SpO2:  [93 %-100 %] 97 % (05/07 0740) Arterial Line BP: (154-179)/(76-82) 173/76 (05/06 1330)     Intake/Output from previous day: 05/06 0701 - 05/07 0700 In: 1140 [P.O.:240; I.V.:900] Out: 2705 [Urine:2275; Blood:50; Chest Tube:380] Intake/Output this shift: Total I/O In: 240 [P.O.:240] Out: 0   General appearance: alert, cooperative, and no distress Neurologic: intact Heart: RRR, NSR on monitor.  Lungs: Clear breath sounds, no air leak from CT.  Wound: port incisions intact and dry, open to air.   Lab Results: Recent Labs    04/08/23 1025 04/09/23 0624  WBC  --  9.6  HGB 11.9* 11.6*  HCT 35.0* 36.7  PLT  --  198   BMET:  Recent Labs    04/08/23 1025 04/09/23 0624  NA 140 136  K 4.1 3.7  CL  --  106  CO2  --  24  GLUCOSE  --  103*  BUN  --  5*  CREATININE  --  0.64  CALCIUM  --  8.9    PT/INR: No results for input(s): "LABPROT", "INR" in the last 72 hours. ABG    Component Value Date/Time   PHART 7.400 04/08/2023 1025   HCO3 23.0 04/08/2023 1025   TCO2 24 04/08/2023 1025   ACIDBASEDEF 2.0 04/08/2023 1025   O2SAT 100 04/08/2023 1025   CBG (last 3)  No results for input(s): "GLUCAP" in the last 72 hours.  Assessment/Plan: S/P Procedure(s) (LRB): XI ROBOTIC ASSISTED THORACOSCOPY-RIGHT LOWER LOBE WEDGE SEGMENTECTOMY  (Right) INTERCOSTAL NERVE BLOCK (Right) LYMPH NODE DISSECTION (Right)  -POD1 RLL wedge resection for stage IB adenocarcinoma.  No air leak, minimal drainage. Removing the chest tube today and wlll get follow up CXR later today. Mobilize.   -H/O HTN- BP 140 systolic, Irbesartan resumed.   DVT PPX- on enoxaparin.      LOS: 1 day    Leary Roca, PA-C 04/09/2023 Patient seen and examined, agree with above 380 recorded from CT but only 280 in Pleuravac, no air leak Looks great Dc chest tube, ambulate  Viviann Spare C. Dorris Fetch, MD [facility] [phone]

## 2023-04-09 NOTE — Plan of Care (Signed)

## 2023-04-09 NOTE — Social Work (Signed)
  Transition of Care [facility]) Screening Note   Patient Details  Name: April Rose Date of Birth: 1956/02/17   Transition of Care [facility]) CM/SW Contact:    Carley Hammed, LCSW Phone Number: 04/09/2023, 3:45 PM    Transition of Care Department [facility]) has reviewed patient and no TOC needs have been identified at this time. We will continue to monitor patient advancement through interdisciplinary progression rounds. If new patient transition needs arise, please place a TOC consult.

## 2023-04-10 ENCOUNTER — Inpatient Hospital Stay (HOSPITAL_COMMUNITY): Payer: Medicare Other

## 2023-04-10 LAB — COMPREHENSIVE METABOLIC PANEL
ALT: 16 U/L (ref 0–44)
AST: 24 U/L (ref 15–41)
Albumin: 3.1 g/dL — ABNORMAL LOW (ref 3.5–5.0)
Alkaline Phosphatase: 45 U/L (ref 38–126)
Anion gap: 6 (ref 5–15)
BUN: 11 mg/dL (ref 8–23)
CO2: 25 mmol/L (ref 22–32)
Calcium: 8.7 mg/dL — ABNORMAL LOW (ref 8.9–10.3)
Chloride: 107 mmol/L (ref 98–111)
Creatinine, Ser: 0.7 mg/dL (ref 0.44–1.00)
GFR, Estimated: >60 mL/min (ref >60)
Glucose, Bld: 106 mg/dL — ABNORMAL HIGH (ref 70–99)
Potassium: 3.7 mmol/L (ref 3.5–5.1)
Sodium: 138 mmol/L (ref 135–145)
Total Bilirubin: 1.1 mg/dL (ref 0.3–1.2)
Total Protein: 5.9 g/dL — ABNORMAL LOW (ref 6.5–8.1)

## 2023-04-10 LAB — CBC
HCT: 34.3 % — ABNORMAL LOW (ref 36.0–46.0)
Hemoglobin: 11.1 g/dL — ABNORMAL LOW (ref 12.0–15.0)
MCH: 26 pg (ref 26.0–34.0)
MCHC: 32.4 g/dL (ref 30.0–36.0)
MCV: 80.3 fL (ref 80.0–100.0)
Platelets: 185 10*3/uL (ref 150–400)
RBC: 4.27 MIL/uL (ref 3.87–5.11)
RDW: 14.8 % (ref 11.5–15.5)
WBC: 7.7 10*3/uL (ref 4.0–10.5)
nRBC: 0 % (ref 0.0–0.2)

## 2023-04-10 MED ORDER — ACETAMINOPHEN 325 MG PO TABS: 650.0000 mg | ORAL_TABLET | ORAL | Status: AC | PRN

## 2023-04-10 MED ORDER — GABAPENTIN 300 MG PO CAPS: 300.0000 mg | ORAL_CAPSULE | Freq: Two times a day (BID) | ORAL | 1 refills | Status: DC

## 2023-04-10 MED ORDER — TRAMADOL HCL 50 MG PO TABS: 50.0000 mg | ORAL_TABLET | Freq: Four times a day (QID) | ORAL | 0 refills | Status: AC | PRN

## 2023-04-10 NOTE — Progress Notes (Addendum)
      [address]       [name] [address]             [phone]      2 Days Post-Op Procedure(s) (LRB): XI ROBOTIC ASSISTED THORACOSCOPY-RIGHT LOWER LOBE WEDGE SEGMENTECTOMY (Right) INTERCOSTAL NERVE BLOCK (Right) LYMPH NODE DISSECTION (Right) Subjective: Sitting up in bed, says she is breathing without any difficulty and pain is controlled.  No new concerns, would like to return home later today.  Objective: Vital signs in last 24 hours: Temp:  [98.2 F (36.8 C)-98.8 F (37.1 C)] 98.4 F (36.9 C) (05/08 0311) Pulse Rate:  [75-88] 78 (05/08 0311) Cardiac Rhythm: Normal sinus rhythm (05/07 1925) Resp:  [15-21] 15 (05/08 0311) BP: (119-156)/(70-83) 131/72 (05/08 0311) SpO2:  [95 %-100 %] 95 % (05/08 0311)     Intake/Output from previous day: 05/07 0701 - 05/08 0700 In: 1934.9 [P.O.:240; I.V.:1694.9] Out: 0  Intake/Output this shift: No intake/output data recorded.  General appearance: alert, cooperative, and no distress Neurologic: intact Heart: RRR, NSR on monitor.  Lungs: Clear breath sounds. Normal WOB on RA, sat 96% Wound: port incisions intact and dry, open to air.   Lab Results: Recent Labs    04/09/23 0624 04/10/23 0018  WBC 9.6 7.7  HGB 11.6* 11.1*  HCT 36.7 34.3*  PLT 198 185    BMET:  Recent Labs    04/09/23 0624 04/10/23 0018  NA 136 138  K 3.7 3.7  CL 106 107  CO2 24 25  GLUCOSE 103* 106*  BUN 5* 11  CREATININE 0.64 0.70  CALCIUM 8.9 8.7*     PT/INR: No results for input(s): "LABPROT", "INR" in the last 72 hours. ABG    Component Value Date/Time   PHART 7.400 04/08/2023 1025   HCO3 23.0 04/08/2023 1025   TCO2 24 04/08/2023 1025   ACIDBASEDEF 2.0 04/08/2023 1025   O2SAT 100 04/08/2023 1025   CBG (last 3)  No results for input(s): "GLUCAP" in the last 72 hours.  Assessment/Plan: S/P Procedure(s) (LRB): XI ROBOTIC ASSISTED THORACOSCOPY-RIGHT LOWER LOBE WEDGE SEGMENTECTOMY (Right) INTERCOSTAL NERVE BLOCK  (Right) LYMPH NODE DISSECTION (Right)  -POD2 RLL wedge resection for stage IB adenocarcinoma.  PCXR post CT removal yesterday showed a <1cm right apical PTX. Follow up CXR this morning pending. If stable will plan for discharge later today.   -H/O HTN- BP 130's systolic,  Irbesartan has been resumed  DVT PPX- on enoxaparin, ambulating.     LOS: 2 days    Leary Roca, PA-C 04/10/2023  Patient seen and examined, agree with above Looks great Path T1b,N0, stage IA adenocarcinoma CXR stable Dc home  Viviann Spare C. Dorris Fetch, MD [facility] [phone]

## 2023-04-10 NOTE — Discharge Instructions (Signed)

## 2023-04-10 NOTE — Discharge Summary (Addendum)
Physician Discharge Summary  Patient ID: April Rose MRN: [id number] DOB/AGE: 04-05-1956 67 y.o.  Admit date: 04/08/2023 Discharge date: 04/10/2023  Admission Diagnoses:  Pulmonary nodule Hypertension  Discharge Diagnoses:   Adenocarcinoma right lower lobe- Clinical stage IB (T2a,N0), Pathologic stage IA (T1,N0) S/P partial lobectomy of lung History of hypertension   PATH:  T1b,N0, stage IA adenocarcinoma   Discharged Condition: stable  History of Present Illness:  April Rose is a 67 year old woman with a history of hypertension, anxiety, and a lung nodule on a CT scan back in 2019.  She says that she recently had a x-ray of her arm and was noted to have a lung nodule.  She had a CT of the chest which showed a 1.6 x 1.4 x 1.3 cm cavitary nodule in the superior segment of the right lower lobe.  She had had a CT of the chest back in 2019 which showed a 9 mm solid-appearing nodule.  There also was a 5 mm groundglass nodule in the left lower lobe that was unchanged from 2019.   She is a lifelong non-smoker.  No coughing, wheezing, shortness of breath, chest pain, pressure, or tightness.  No overt fevers, chills, or night sweats.  Works as an Public house manager.  Originally from [place].  Has been in the [place] for 30 years, but does travel back occasionally.   She was evaluated by Dr .Dorris Fetch in in February but she wanted to wait until May for her surgery due to a trip she had planned to [place].  She she went to [place] and recently returned.  She did note some swelling in her legs after each leg of the trip but it was equal bilaterally and resolved.  No calf pain or tenderness.  He recommended Robotic Assisted Resection of the lung mass with possible segmentectomy should the mass be a cancer.  The risks and benefits of the procedure were explained to the patient and she was agreeable to proceed.  Hospital Course:  [name] presented to [facility] on 04/08/2023.  She was taken to the  operating room and underwent Robotic Assisted Right Video Assisted Thoracoscopy with wedge resection RLL, Right Lower Superior Segmentectomy, Lymph Node Dissection, and Intercostal nerve block.  She tolerated the procedure without difficulty, was extubated, and taken to the PACU in stable condition. The chest tube was removed on post-op day 1. Follow up CXR a few hours later showed an 8-50mm apical pneumothorax. [name] did not feel short of breath and continued to maintain adequate O2 saturations on room air. Diet and activity were advanced routinely and well tolerated. Follow up CXR on post-op day 2 was stable with a small right apical PTX. Lung fields were otherwise clear. [name] was ambulating without difficulty and maintaining good O2 sats on RA. Incisions were intact abd dry with no sign of complication.   Consults: None  Significant Diagnostic Studies:   CLINICAL DATA:  Status post partial right lobectomy.   EXAM: CHEST - 1 VIEW SAME DAY   COMPARISON:  Chest radiograph earlier the same day.   FINDINGS: The right IJ vascular catheter is stable terminating in the right atrium. The right chest tube has been removed.   The cardiomediastinal silhouette is normal.   There is a small right pneumothorax following chest tube removal. There is no focal consolidation or pulmonary edema. There is no pleural effusion. There is no left pneumothorax   There is no acute osseous abnormality.   IMPRESSION: Small right apical pneumothorax  following chest tube removal.   Electronically Signed   By: Lesia Hausen M.D.   On: 04/09/2023 13:26  Treatments:  Operative Report    DATE OF PROCEDURE: 04/08/2023   PREOPERATIVE DIAGNOSIS:  Right lower lobe lung nodule.   POSTOPERATIVE DIAGNOSIS:  Adenocarcinoma, right lower lobe, clinical stage IB (T2a N0).   PROCEDURE:  Xi robotic-assisted right lower lobe superior segmentectomy, lymph node dissection and intercostal nerve blocks levels 3  through 10.   SURGEON:  Salvatore Decent. Dorris Fetch, MD   ASSISTANT:  Lowella Dandy, PA   ANESTHESIA:  General.   FINDINGS:  Nodule clearly visible on visceral pleural surface with likely involvement of the visceral pleural surface.  Frozen section revealed adenocarcinoma.  Bronchial margin negative for tumor.  Normal-appearing lymph nodes.  Discharge Exam: Blood pressure 118/67, pulse 88, temperature 98.6 F (37 C), temperature source Oral, resp. rate 19, height 5\' 2"  (1.575 m), weight 71.7 kg, SpO2 97 %.  General appearance: alert, cooperative, and no distress Neurologic: intact Heart: RRR, NSR on monitor.  Lungs: Clear breath sounds. Normal WOB on RA, sat 96% Wound: port incisions intact and dry, open to air.   Disposition:  Discharged to home in stable condition.   Allergies as of 04/10/2023       Reactions   Lisinopril Diarrhea        Medication List     TAKE these medications    acetaminophen 325 MG tablet Commonly known as: TYLENOL Take 2 tablets (650 mg total) by mouth every 4 (four) hours as needed.   ESTER C PO Take 1 tablet by mouth daily.   gabapentin 300 MG capsule Commonly known as: NEURONTIN Take 1 capsule (300 mg total) by mouth 2 (two) times daily. Start taking on: Apr 11, 2023   olmesartan 5 MG tablet Commonly known as: BENICAR Take 5 mg by mouth daily.   traMADol 50 MG tablet Commonly known as: ULTRAM Take 1-2 tablets (50-100 mg total) by mouth every 6 (six) hours as needed for up to 7 days (mild pain).           Signed: Leary Roca, PA-C 04/10/2023, 8:26 AM

## 2023-04-10 NOTE — TOC Transition Note (Signed)
Transition of Care [facility]) - CM/SW Discharge Note   Patient Details  Name: April Rose MRN: [id number] Date of Birth: May 23, 1956  [facility] CM/SW Contact:  Harriet Masson, RN Phone Number: 04/10/2023, 10:07 AM   Clinical Narrative:     Patient stable for discharge.  No TOC needs at this time.  Final next level of care: Home/Self Care Barriers to Discharge: Barriers Resolved   Patient Goals and CMS Choice      Discharge Placement                         Discharge Plan and Services Additional resources added to the After Visit Summary for                                       Social Determinants of Health (SDOH) Interventions SDOH Screenings   Tobacco Use: Low Risk  (04/09/2023)     Readmission Risk Interventions    04/10/2023   10:07 AM  Readmission Risk Prevention Plan  Post Dischage Appt Complete  Medication Screening Complete  Transportation Screening Complete

## 2023-04-12 ENCOUNTER — Telehealth: Payer: Self-pay

## 2023-04-12 ENCOUNTER — Encounter: Payer: Self-pay | Admitting: Thoracic Surgery (Cardiothoracic Vascular Surgery)

## 2023-04-12 NOTE — Telephone Encounter (Signed)
Patient contacted the office with concerns about drainage from her chest tube site. She is s/p RATS RLL lobectomy with Dr. Dorris Fetch 5/6 and was discharged 5/8. She states the drainage is yellowish-pink and at times it is a "gush" from the chest tube site when she coughs or deeps heavily. She did send a picture through mychart, incisions look well approximated and healing nicely. Advised that the drainage is common and should stop within the next day, but that if it did not stop she should contact the office for follow-up. She acknowledged receipt. Also, advise to contact the office if anything changes with her incisions or drainage color or temperature increases. She acknowledged receipt.

## 2023-04-16 ENCOUNTER — Ambulatory Visit: Payer: Self-pay | Admitting: *Deleted

## 2023-04-16 DIAGNOSIS — Z4802 Encounter for removal of sutures: Secondary | ICD-10-CM

## 2023-04-16 NOTE — Progress Notes (Signed)
Patient arrived for nurse visit to remove sutures post-right RATs segmentectomy 5/6 by Dr. Dorris Fetch.  One suture removed with no signs or symptoms of infection noted.  Patient tolerated suture removal well.  Patient verbalized chest tube site has been draining since tube removal. Bandage over incision with serosanguinous drainage on it. Patient states site will drain with deep breath and coughing. Patient denies SOB or fever. Advised patient that is it not abnormal for chest tube site to leak. Advised patient to continue to monitor site, drainage color and amount. Advised to call back if signs/symptoms of infection develop or if she becomes SOB. Offered for patient to get chest xray to check effusion, however patient has a follow up appt next week she states she will wait for. Patient and family instructed to keep the incision site clean and dry. Patient and family acknowledged instructions given.  All questions answered.

## 2023-04-18 ENCOUNTER — Other Ambulatory Visit: Payer: Self-pay | Admitting: *Deleted

## 2023-04-18 NOTE — Progress Notes (Signed)
The proposed treatment discussed in conference is for discussion purpose only and is not a binding recommendation.  The patients have not been physically examined, or presented with their treatment options.  Therefore, final treatment plans cannot be decided.  

## 2023-04-22 ENCOUNTER — Other Ambulatory Visit: Payer: Self-pay | Admitting: Thoracic Surgery (Cardiothoracic Vascular Surgery)

## 2023-04-22 ENCOUNTER — Telehealth: Payer: Self-pay | Admitting: *Deleted

## 2023-04-22 DIAGNOSIS — C3490 Malignant neoplasm of unspecified part of unspecified bronchus or lung: Secondary | ICD-10-CM

## 2023-04-22 NOTE — Telephone Encounter (Signed)
Patient contacted the answering service over the weekend regarding a cough after surgery. Per patient, Dr. Dorris Fetch returned her call and advised she take Robitussin for cough. Patient states medication is helping. Nothing further at this time.

## 2023-04-23 ENCOUNTER — Ambulatory Visit
Admission: RE | Admit: 2023-04-23 | Discharge: 2023-04-23 | Disposition: A | Discharge status: Home / Self Care | Payer: Medicare Other | Source: Ambulatory Visit | Attending: Thoracic Surgery (Cardiothoracic Vascular Surgery) | Admitting: Thoracic Surgery (Cardiothoracic Vascular Surgery)

## 2023-04-23 ENCOUNTER — Encounter: Payer: Self-pay | Admitting: Thoracic Surgery (Cardiothoracic Vascular Surgery)

## 2023-04-23 ENCOUNTER — Ambulatory Visit (INDEPENDENT_AMBULATORY_CARE_PROVIDER_SITE_OTHER): Payer: Self-pay | Admitting: Thoracic Surgery (Cardiothoracic Vascular Surgery)

## 2023-04-23 VITALS — BP 138/83 | HR 83 | Resp 18 | Ht 62.0 in | Wt 153.0 lb

## 2023-04-23 DIAGNOSIS — Z09 Encounter for follow-up examination after completed treatment for conditions other than malignant neoplasm: Secondary | ICD-10-CM

## 2023-04-23 DIAGNOSIS — Z85118 Personal history of other malignant neoplasm of bronchus and lung: Secondary | ICD-10-CM | POA: Diagnosis not present

## 2023-04-23 DIAGNOSIS — R059 Cough, unspecified: Secondary | ICD-10-CM | POA: Diagnosis not present

## 2023-04-23 DIAGNOSIS — C3490 Malignant neoplasm of unspecified part of unspecified bronchus or lung: Secondary | ICD-10-CM

## 2023-04-23 DIAGNOSIS — C3491 Malignant neoplasm of unspecified part of right bronchus or lung: Secondary | ICD-10-CM

## 2023-04-23 MED ORDER — PREDNISONE 10 MG PO TABS
10.0000 mg | ORAL_TABLET | Freq: Every day | ORAL | 0 refills | Status: DC
Start: 2023-04-23 — End: 2023-05-28

## 2023-04-23 NOTE — Progress Notes (Signed)
[address]       [name] [address]             [phone]     HPI: April Rose returns for follow-up after recent segmentectomy.  April Rose is a 67 year old woman with a history of hypertension, anxiety, and stage Ia adenocarcinoma of the lung.  She was noted to have a lung nodule back in 2019 but never had follow-up.  She recently had a chest x-ray prior to elbow surgery and the lung nodule was noted.  His CT showed a 1.6 x 1.4 x 1.3 cm cavitary nodule in the superior segment of the right lower lobe.  She is originally from [place] and travels there relatively frequently.  Differential diagnosis was low-grade adenocarcinoma versus granulomatous disease.  I did a right lower lobe superior segmentectomy on 04/08/2023.  Nodule turned out to be a T1b, N0, stage Ia adenocarcinoma.  She did well postoperatively and went home on day 2.  She feels well.  She has had a cough.  She has been taking Robitussin, which helps a little.  Has some pain and some numbness along the right costal margin but is not taking any narcotics.  She is on gabapentin twice daily.  Past Medical History:  Diagnosis Date   Adenocarcinoma of lung, stage 1, right (HCC)    Status post segmentectomy 04/2023   Anxiety    Hypertension     Current Outpatient Medications  Medication Sig Dispense Refill   acetaminophen (TYLENOL) 325 MG tablet Take 2 tablets (650 mg total) by mouth every 4 (four) hours as needed.     Bioflavonoid Products (ESTER C PO) Take 1 tablet by mouth daily.     gabapentin (NEURONTIN) 300 MG capsule Take 1 capsule (300 mg total) by mouth 2 (two) times daily. 60 capsule 1   olmesartan (BENICAR) 5 MG tablet Take 5 mg by mouth daily.     predniSONE (DELTASONE) 10 MG tablet Take 1 tablet (10 mg total) by mouth daily with breakfast. 10 tablet 0   No current facility-administered medications for this visit.    Physical Exam BP 138/83 (BP Location: Right Arm, Patient Position:  Sitting)   Pulse 83   Resp 18   Ht 5\' 2"  (1.575 m)   Wt 153 lb (69.4 kg)   SpO2 92% Comment: RA  BMI 27.81 kg/m  68 year old woman in no acute distress Alert and oriented x 3 with no focal deficits Lungs clear with equal breath sounds bilaterally Incisions clean dry and intact Cardiac regular rate and rhythm  Diagnostic Tests: CHEST - 2 VIEW   COMPARISON:  Apr 10, 2023.   FINDINGS: The heart size and mediastinal contours are within normal limits. Left lung is clear. No pneumothorax is noted. Right midlung postoperative changes noted. Minimal pleural thickening or effusion is noted in right lung base. The visualized skeletal structures are unremarkable.   IMPRESSION: Postoperative changes noted in right midlung. Minimal right basilar pleural thickening or small pleural effusion.     Electronically Signed   By: Lupita Raider M.D.   On: 04/23/2023 10:37  Impression: April Rose is a 67 year old woman with a history of hypertension, anxiety, and stage Ia adenocarcinoma of the lung.   Adenocarcinoma of the lung- pathologic stage Ia.  Does not need any adjuvant therapy.  Will need continued follow-up.  She is a never smoker.  Will refer to Dr. Arbutus Ped for long-term follow-up.  Status post right lower  lobe superior segmentectomy-doing well.  Only significant issue is a persistent cough.  She has been using over-the-counter Robitussin which helps but the cough is still present.  Recommended we try a 10-day course of prednisone 10 mg daily to see if that will help decrease the inflammation.  She is anxious to return to work.  We set a tentative date of 05/13/2023.  Did advise her not to lift over 50 pounds when she returns to work.  Plan: Prednisone 10 mg p.o. daily x 10 days Refer to Dr. Arbutus Ped Return in 1 month with PA lateral chest x-ray  Loreli Slot, MD [facility] [phone]

## 2023-04-25 ENCOUNTER — Ambulatory Visit: Payer: Medicare Other | Admitting: Thoracic Surgery (Cardiothoracic Vascular Surgery)

## 2023-04-26 ENCOUNTER — Telehealth: Payer: Self-pay | Admitting: Internal Medicine

## 2023-04-26 NOTE — Telephone Encounter (Signed)
scheduled per referral, pt has been called and confirmed date and time. Pt is aware of location and to arrive early for check in. Offered a sooner date but pt requested appt on 6/25

## 2023-05-04 ENCOUNTER — Emergency Department (HOSPITAL_COMMUNITY): Payer: Medicare Other

## 2023-05-04 ENCOUNTER — Encounter (HOSPITAL_COMMUNITY): Payer: Self-pay

## 2023-05-04 ENCOUNTER — Other Ambulatory Visit: Payer: Self-pay

## 2023-05-04 ENCOUNTER — Emergency Department (HOSPITAL_COMMUNITY)
Admission: EM | Admit: 2023-05-04 | Discharge: 2023-05-04 | Disposition: A | Discharge status: Home / Self Care | Payer: Medicare Other | Attending: Emergency Medicine | Admitting: Emergency Medicine

## 2023-05-04 DIAGNOSIS — R0781 Pleurodynia: Secondary | ICD-10-CM | POA: Diagnosis not present

## 2023-05-04 LAB — COMPREHENSIVE METABOLIC PANEL
ALT: 17 U/L (ref 0–44)
AST: 16 U/L (ref 15–41)
Albumin: 3.8 g/dL (ref 3.5–5.0)
Alkaline Phosphatase: 69 U/L (ref 38–126)
Anion gap: 12 (ref 5–15)
BUN: 11 mg/dL (ref 8–23)
CO2: 25 mmol/L (ref 22–32)
Calcium: 9.5 mg/dL (ref 8.9–10.3)
Chloride: 102 mmol/L (ref 98–111)
Creatinine, Ser: 0.79 mg/dL (ref 0.44–1.00)
GFR, Estimated: >60 mL/min (ref >60)
Glucose, Bld: 98 mg/dL (ref 70–99)
Potassium: 4.1 mmol/L (ref 3.5–5.1)
Sodium: 139 mmol/L (ref 135–145)
Total Bilirubin: 0.6 mg/dL (ref 0.3–1.2)
Total Protein: 6.5 g/dL (ref 6.5–8.1)

## 2023-05-04 LAB — CBC
HCT: 38.9 % (ref 36.0–46.0)
Hemoglobin: 12 g/dL (ref 12.0–15.0)
MCH: 25.5 pg — ABNORMAL LOW (ref 26.0–34.0)
MCHC: 30.8 g/dL (ref 30.0–36.0)
MCV: 82.6 fL (ref 80.0–100.0)
Platelets: 229 10*3/uL (ref 150–400)
RBC: 4.71 MIL/uL (ref 3.87–5.11)
RDW: 14.5 % (ref 11.5–15.5)
WBC: 6.7 10*3/uL (ref 4.0–10.5)
nRBC: 0 % (ref 0.0–0.2)

## 2023-05-04 LAB — URINALYSIS, ROUTINE W REFLEX MICROSCOPIC
Bacteria, UA: NONE SEEN
Bilirubin Urine: NEGATIVE
Glucose, UA: NEGATIVE mg/dL
Ketones, ur: NEGATIVE mg/dL
Leukocytes,Ua: NEGATIVE
Nitrite: NEGATIVE
Protein, ur: NEGATIVE mg/dL
Specific Gravity, Urine: 1.009 (ref 1.005–1.030)
pH: 6 (ref 5.0–8.0)

## 2023-05-04 LAB — LIPASE, BLOOD: Lipase: 27 U/L (ref 11–51)

## 2023-05-04 MED ORDER — LIDOCAINE 5 % EX PTCH: 1.0000 | MEDICATED_PATCH | CUTANEOUS | 0 refills | Status: DC

## 2023-05-04 MED ORDER — LIDOCAINE 5 % EX PTCH
1.0000 | MEDICATED_PATCH | CUTANEOUS | Status: DC
  Administered 2023-05-04: 1 via TRANSDERMAL
  Filled 2023-05-04: qty 1

## 2023-05-04 NOTE — ED Provider Notes (Signed)
[place] EMERGENCY DEPARTMENT AT [facility] Provider Note   CSN: [id number] Arrival date & time: 05/04/23  1115     History  Chief Complaint  Patient presents with   Abdominal Pain    April Rose is a 67 y.o. female status post partial lobectomy of the lung 04/08/2023 presenting with right rib pain that began last night.  Patient states she has tried tramadol with no relief.  Patient states that her right ribs hurt when palpated and if she takes a deep breath.  Patient denies any recent trauma or falls.  Patient denied radiation of this right rib pain.  Patient denied chest pain, shortness of breath, abdominal pain, nausea vomiting, back pain  Home Medications Prior to Admission medications   Medication Sig Start Date End Date Taking? Authorizing Provider  lidocaine (LIDODERM) 5 % Place 1 patch onto the skin daily. Remove & Discard patch within 12 hours or as directed by MD 05/04/23  Yes Margarita Bobrowski, Beverly Gust, PA-C  acetaminophen (TYLENOL) 325 MG tablet Take 2 tablets (650 mg total) by mouth every 4 (four) hours as needed. 04/10/23   Roddenberry, Cecille Amsterdam, PA-C  Bioflavonoid Products (ESTER C PO) Take 1 tablet by mouth daily.    [provider]  gabapentin (NEURONTIN) 300 MG capsule Take 1 capsule (300 mg total) by mouth 2 (two) times daily. 04/11/23   Leary Roca, PA-C  olmesartan (BENICAR) 5 MG tablet Take 5 mg by mouth daily.    [provider]  predniSONE (DELTASONE) 10 MG tablet Take 1 tablet (10 mg total) by mouth daily with breakfast. 04/23/23   Loreli Slot, MD      Allergies    Lisinopril    Review of Systems   Review of Systems  Musculoskeletal:        Right rib pain    Physical Exam Updated Vital Signs BP 131/79 (BP Location: Right Arm)   Pulse 74   Temp 98.2 F (36.8 C) (Oral)   Resp 18   Ht 5\' 2"  (1.575 m)   Wt 68 kg   SpO2 96%   BMI 27.44 kg/m  Physical Exam Constitutional:      General: She is not in acute  distress.    Comments: Resting comfortably on her phone  Cardiovascular:     Rate and Rhythm: Normal rate and regular rhythm.     Pulses: Normal pulses.     Heart sounds: Normal heart sounds. No murmur heard. Pulmonary:     Effort: Pulmonary effort is normal. No respiratory distress.     Breath sounds: Normal breath sounds.  Abdominal:     Palpations: Abdomen is soft.     Tenderness: There is no abdominal tenderness. There is no guarding or rebound.  Musculoskeletal:        General: Normal range of motion.     Cervical back: Normal range of motion.     Comments: Right lower rib pain without abnormalities palpated  Skin:    General: Skin is warm and dry.     Comments: Surgical scars noted over right ribs without signs of infection  Neurological:     Mental Status: She is alert.     ED Results / Procedures / Treatments   Labs (all labs ordered are listed, but only abnormal results are displayed) Labs Reviewed  CBC - Abnormal; Notable for the following components:      Result Value   MCH 25.5 (*)    All other components  within normal limits  URINALYSIS, ROUTINE W REFLEX MICROSCOPIC - Abnormal; Notable for the following components:   Color, Urine STRAW (*)    Hgb urine dipstick SMALL (*)    All other components within normal limits  LIPASE, BLOOD  COMPREHENSIVE METABOLIC PANEL    EKG None  Radiology DG Ribs Unilateral W/Chest Right  Result Date: 05/04/2023 CLINICAL DATA:  Recent right lung surgery EXAM: RIGHT RIBS AND CHEST - 3 VIEW COMPARISON:  Chest x-ray dated Apr 23, 2023 FINDINGS: No fracture or other bone lesions are seen involving the ribs. No evidence of pneumothorax or pleural effusion. Stable postsurgical changes of the right lung. Both lungs are clear. Heart size and mediastinal contours are within normal limits. IMPRESSION: 1. No displaced rib fracture. 2. Stable postsurgical changes of the right lung. 3. No acute cardiopulmonary process. Electronically Signed    By: Allegra Lai M.D.   On: 05/04/2023 14:43    Procedures Procedures    Medications Ordered in ED Medications  lidocaine (LIDODERM) 5 % 1 patch (has no administration in time range)    ED Course/ Medical Decision Making/ A&P                             Medical Decision Making Amount and/or Complexity of Data Reviewed Labs: ordered. Radiology: ordered.   April Rose 67 y.o. presented today for right rib pain. Working DDx that I considered at this time includes, but not limited to, pain secondary to surgery, rib fracture, pneumothorax, hepatitis, cholecystitis, pancreatitis.  R/o DDx: Rib fracture, pneumothorax, hepatitis, cholecystitis, pancreatitis: These are considered less likely due to history of present illness and physical exam findings  Review of prior external notes: 04/08/2023 discharge summary  Unique Tests and My Interpretation:  CBC: Unremarkable UA: Unremarkable Lipase: Unremarkable CMP: Unremarkable Right ribs x-ray: No acute osseous changes, no pneumothorax  Discussion with Independent Historian:  Husband  Discussion of Management of Tests: None  Risk: Low: based on diagnostic testing/clinical impression and treatment plan  Risk Stratification Score: none  Staffed with Anitra Lauth, MD  Plan: Patient presented for right rib pain. On exam patient was in no acute distress and stable vitals.  Patient's exam was remarkable for right rib tenderness without abnormalities palpated.  Patient did not have any abdominal tenderness in her lungs and heart both sounded healthy.  Patient's labs in triage were unremarkable.  I suspect patient's rib pain is secondary to her recent lung surgery and so right rib x-ray was ordered to evaluate.  Patient stable at this time.  Patient's x-rays came back negative.  Patient be discharged and encouraged to follow-up with her primary care provider.  Patient already has tramadol at home and will not be given narcotics today.   Patient has been resting calmly throughout ED stay and has not required pain meds.  Patient be given lidocaine patch before discharge and given a prescription of these as well.  Patient was given return precautions. Patient stable for discharge at this time.  Patient verbalized understanding of plan.         Final Clinical Impression(s) / ED Diagnoses Final diagnoses:  Rib pain on right side    Rx / DC Orders ED Discharge Orders          Ordered    lidocaine (LIDODERM) 5 %  Every 24 hours        05/04/23 1515  Netta Corrigan, PA-C 05/04/23 1516    Gwyneth Sprout, MD 05/04/23 2055

## 2023-05-04 NOTE — ED Notes (Signed)
Pt states she got lung biopsy done May 6th on right side

## 2023-05-04 NOTE — ED Triage Notes (Addendum)
Pt came to ED for upper abd pain that radiated to right flank and upper arm started at 3am. Denies n/v. Axox4. Pt took 25mg  of tramadol and pt states it barely worked.

## 2023-05-04 NOTE — Discharge Instructions (Addendum)
Please follow-up with your primary care regarding recent symptoms and ER visit.  Today your labs and imaging were all reassuring and did not show signs of fracture or other pathology.  I have also prescribed lidocaine patches to help give you topical relief as well.  Please continue to take your tramadol as prescribed and if symptoms worsen or change please return to ER.

## 2023-05-06 ENCOUNTER — Telehealth: Payer: Self-pay | Admitting: *Deleted

## 2023-05-06 NOTE — Telephone Encounter (Signed)
Patient contacted the answering service over the weekend regarding pain. Per patient, she did receive a call back from Dr. Dorris Fetch. Patient also states she was seen in the ED for this pain. Patient states she has been taking Tramadol for the pain which helps. Advised patient she may also take Tylenol as needed for pain. Patient verbalized understanding.

## 2023-05-10 ENCOUNTER — Other Ambulatory Visit: Payer: Self-pay | Admitting: Physician Assistant

## 2023-05-10 ENCOUNTER — Encounter: Payer: Self-pay | Admitting: *Deleted

## 2023-05-10 MED ORDER — TRAMADOL HCL 50 MG PO TABS: 50.0000 mg | ORAL_TABLET | Freq: Four times a day (QID) | ORAL | 0 refills | Status: DC | PRN

## 2023-05-13 ENCOUNTER — Telehealth: Payer: Self-pay | Admitting: *Deleted

## 2023-05-13 NOTE — Telephone Encounter (Signed)
-----   Message from Ardelle Balls, [place] sent at 05/10/2023  1:02 PM EDT ----- Regarding: RE: tramadol refill I will send refill for Ultram to pharmacy on record. Thank you ----- Message ----- From: Ludwig Clarks, RN Sent: 05/10/2023  12:59 PM EDT To: Ardelle Balls, PA-C Subject: tramadol refill                                Donielle,   Patient called requesting a refill of Tramadol. She is s/p RATs segmentectomy 5/6 with Dorris Fetch. States she takes Tylenol throughout the day and will require a Tramadol around 3am when she is awoken with right sided pain. States Tramadol significantly helps. Is scheduled to see Dorris Fetch again 6/25. Please advise.   Thanks,  [employer]

## 2023-05-27 ENCOUNTER — Other Ambulatory Visit: Payer: Self-pay | Admitting: *Deleted

## 2023-05-27 DIAGNOSIS — C3491 Malignant neoplasm of unspecified part of right bronchus or lung: Secondary | ICD-10-CM

## 2023-05-28 ENCOUNTER — Encounter: Payer: Self-pay | Admitting: Thoracic Surgery (Cardiothoracic Vascular Surgery)

## 2023-05-28 ENCOUNTER — Ambulatory Visit (INDEPENDENT_AMBULATORY_CARE_PROVIDER_SITE_OTHER): Payer: Self-pay | Admitting: Thoracic Surgery (Cardiothoracic Vascular Surgery)

## 2023-05-28 ENCOUNTER — Other Ambulatory Visit: Payer: Self-pay | Admitting: Thoracic Surgery (Cardiothoracic Vascular Surgery)

## 2023-05-28 ENCOUNTER — Ambulatory Visit
Admission: RE | Admit: 2023-05-28 | Discharge: 2023-05-28 | Disposition: A | Discharge status: Home / Self Care | Payer: Medicare Other | Source: Ambulatory Visit | Attending: Thoracic Surgery (Cardiothoracic Vascular Surgery) | Admitting: Thoracic Surgery (Cardiothoracic Vascular Surgery)

## 2023-05-28 ENCOUNTER — Inpatient Hospital Stay: Payer: Medicare Other | Attending: Internal Medicine | Admitting: Internal Medicine

## 2023-05-28 ENCOUNTER — Inpatient Hospital Stay: Payer: Medicare Other

## 2023-05-28 VITALS — BP 143/76 | HR 86 | Resp 20 | Ht 62.0 in | Wt 152.2 lb

## 2023-05-28 VITALS — BP 137/86 | HR 80 | Temp 97.2°F | Resp 14 | Wt 151.8 lb

## 2023-05-28 DIAGNOSIS — C3431 Malignant neoplasm of lower lobe, right bronchus or lung: Secondary | ICD-10-CM | POA: Insufficient documentation

## 2023-05-28 DIAGNOSIS — C3491 Malignant neoplasm of unspecified part of right bronchus or lung: Secondary | ICD-10-CM

## 2023-05-28 DIAGNOSIS — C3490 Malignant neoplasm of unspecified part of unspecified bronchus or lung: Secondary | ICD-10-CM

## 2023-05-28 DIAGNOSIS — Z48813 Encounter for surgical aftercare following surgery on the respiratory system: Secondary | ICD-10-CM | POA: Diagnosis not present

## 2023-05-28 DIAGNOSIS — Z902 Acquired absence of lung [part of]: Secondary | ICD-10-CM

## 2023-05-28 LAB — CBC WITH DIFFERENTIAL (CANCER CENTER ONLY)
Abs Immature Granulocytes: 0.01 10*3/uL (ref 0.00–0.07)
Basophils Absolute: 0 10*3/uL (ref 0.0–0.1)
Basophils Relative: 1 %
Eosinophils Absolute: 0.1 10*3/uL (ref 0.0–0.5)
Eosinophils Relative: 2 %
HCT: 38.2 % (ref 36.0–46.0)
Hemoglobin: 12.3 g/dL (ref 12.0–15.0)
Immature Granulocytes: 0 %
Lymphocytes Relative: 61 %
Lymphs Abs: 2.7 10*3/uL (ref 0.7–4.0)
MCH: 26.2 pg (ref 26.0–34.0)
MCHC: 32.2 g/dL (ref 30.0–36.0)
MCV: 81.3 fL (ref 80.0–100.0)
Monocytes Absolute: 0.3 10*3/uL (ref 0.1–1.0)
Monocytes Relative: 7 %
Neutro Abs: 1.3 10*3/uL — ABNORMAL LOW (ref 1.7–7.7)
Neutrophils Relative %: 29 %
Platelet Count: 197 10*3/uL (ref 150–400)
RBC: 4.7 MIL/uL (ref 3.87–5.11)
RDW: 14.4 % (ref 11.5–15.5)
WBC Count: 4.4 10*3/uL (ref 4.0–10.5)
nRBC: 0 % (ref 0.0–0.2)

## 2023-05-28 LAB — CMP (CANCER CENTER ONLY)
ALT: 19 U/L (ref 0–44)
AST: 21 U/L (ref 15–41)
Albumin: 4 g/dL (ref 3.5–5.0)
Alkaline Phosphatase: 68 U/L (ref 38–126)
Anion gap: 6 (ref 5–15)
BUN: 8 mg/dL (ref 8–23)
CO2: 27 mmol/L (ref 22–32)
Calcium: 9.7 mg/dL (ref 8.9–10.3)
Chloride: 109 mmol/L (ref 98–111)
Creatinine: 0.76 mg/dL (ref 0.44–1.00)
GFR, Estimated: >60 mL/min (ref >60)
Glucose, Bld: 106 mg/dL — ABNORMAL HIGH (ref 70–99)
Potassium: 3.7 mmol/L (ref 3.5–5.1)
Sodium: 142 mmol/L (ref 135–145)
Total Bilirubin: 0.7 mg/dL (ref 0.3–1.2)
Total Protein: 7 g/dL (ref 6.5–8.1)

## 2023-05-28 MED ORDER — GABAPENTIN 300 MG PO CAPS: 300.0000 mg | ORAL_CAPSULE | Freq: Two times a day (BID) | ORAL | 1 refills | Status: AC

## 2023-05-28 NOTE — Progress Notes (Signed)
[facility] [phone]   [phone]  CONSULT NOTE  REFERRING PHYSICIAN: Dr. Charlett Lango  REASON FOR CONSULTATION:  67 years old African-American female recently diagnosed with lung cancer.  HPI April Rose is a 67 y.o. female with past medical history significant for anxiety and hypertension.  The patient is a never smoker.  In May 2019 the patient had right axillary and lateral rib pain.  She had CT scan of the chest with contrast on 04/03/2018 that showed incidental right lower lobe pulmonary nodule measuring 0.9 cm.  For some reason she was lost to follow-up but on December 10, 2022 she had repeat CT scan of the chest without contrast and that showed the previously described right lower lobe superior segment nodule had increased in size and measured 1.6 x 1.3 x 1.4 cm.  The nodule was cavitary and suspicious for low-grade malignancy.  There was also 0.5 cm groundglass nodule in the left lower lobe that has been stable consistent with a benign etiology and no acute findings in the chest.  The patient was referred to Dr. Dorris Fetch and on 04/08/2023 she underwent robotic assisted right lower lobe superior segmentectomy with lymph node dissection. The final pathology [id number]) showed invasive moderately differentiated carcinoma, acinar predominant measuring 1.1 cm with no evidence for visceral pleural or lymphovascular invasion and the dissected lymph nodes were negative for malignancy. The patient was referred to me today for evaluation and recommendation regarding her condition. When seen today the patient is feeling fine except for a tickle in her throat after the surgery with mild cough.  The pain on the right side of the chest is improving.  She has no shortness of breath or hemoptysis.  She has no recent weight loss or night sweats.  She has no nausea, vomiting, diarrhea or constipation.  She has no headache or visual changes. Family history  significant for father died at age 20 from PE but he also has a history for stroke and diabetes mellitus.  Mother died at age 47 from old age. The patient is married and has 2 children son and daughter.  She is originally from [place].  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  The patient has no history for smoking but drinks alcohol occasionally and no history of drug abuse.  HPI  Past Medical History:  Diagnosis Date   Adenocarcinoma of lung, stage 1, right (HCC)    Status post segmentectomy 04/2023   Anxiety    Hypertension     Past Surgical History:  Procedure Laterality Date   COLONOSCOPY     ~ 15 yrs ago   COLONOSCOPY  2024   INTERCOSTAL NERVE BLOCK Right 04/08/2023   Procedure: INTERCOSTAL NERVE BLOCK;  Surgeon: Loreli Slot, MD;  Location: [facility] OR;  Service: Thoracic;  Laterality: Right;   LYMPH NODE DISSECTION Right 04/08/2023   Procedure: LYMPH NODE DISSECTION;  Surgeon: Loreli Slot, MD;  Location: [facility] OR;  Service: Thoracic;  Laterality: Right;    Family History  Problem Relation Age of Onset   Colon cancer Neg Hx    Colon polyps Neg Hx    Esophageal cancer Neg Hx    Stomach cancer Neg Hx    Rectal cancer Neg Hx     Social History Social History   Tobacco Use   Smoking status: Never   Smokeless tobacco: Never  Vaping Use   Vaping Use: Never used  Substance Use Topics   Alcohol use: Yes    Comment: occ wine and beer- every few months   Drug use: Never    Allergies  Allergen Reactions   Lisinopril Diarrhea    Current Outpatient Medications  Medication Sig Dispense Refill   acetaminophen (TYLENOL) 325 MG tablet Take 2 tablets (650 mg total) by mouth every 4 (four) hours as needed.     Bioflavonoid Products (ESTER C PO) Take 1 tablet by mouth daily.     gabapentin (NEURONTIN) 300 MG capsule Take 1 capsule (300 mg total) by mouth 2 (two) times daily. 60 capsule 1   lidocaine (LIDODERM) 5 % Place 1  patch onto the skin daily. Remove & Discard patch within 12 hours or as directed by MD 30 patch 0   olmesartan (BENICAR) 5 MG tablet Take 5 mg by mouth daily.     predniSONE (DELTASONE) 10 MG tablet Take 1 tablet (10 mg total) by mouth daily with breakfast. 10 tablet 0   traMADol (ULTRAM) 50 MG tablet Take 1 tablet (50 mg total) by mouth every 6 (six) hours as needed. 24 tablet 0   No current facility-administered medications for this visit.    Review of Systems  Constitutional: negative Eyes: negative Ears, nose, mouth, throat, and face: negative Respiratory: positive for pleurisy/chest pain Cardiovascular: negative Gastrointestinal: negative Genitourinary:negative Integument/breast: negative Hematologic/lymphatic: negative Musculoskeletal:negative Neurological: negative Behavioral/Psych: negative Endocrine: negative Allergic/Immunologic: negative  Physical Exam  BJY:NWGNF, healthy, no distress, well nourished, and well developed SKIN: skin color, texture, turgor are normal, no rashes or significant lesions HEAD: Normocephalic, No masses, lesions, tenderness or abnormalities EYES: normal, PERRLA EARS: External ears normal, Canals clear OROPHARYNX:no exudate, no erythema, and lips, buccal mucosa, and tongue normal  NECK: supple, no adenopathy, no JVD LYMPH:  no palpable lymphadenopathy, no hepatosplenomegaly BREAST:not examined LUNGS: clear to auscultation , and palpation HEART: regular rate & rhythm, no murmurs, and no gallops ABDOMEN:abdomen soft, non-tender, normal bowel sounds, and no masses or organomegaly BACK: Back symmetric, no curvature., No CVA tenderness EXTREMITIES:no joint deformities, effusion, or inflammation, no edema  NEURO: alert & oriented x 3 with fluent speech, no focal motor/sensory deficits  PERFORMANCE STATUS: ECOG 0  LABORATORY DATA: Lab Results  Component Value Date   WBC 6.7 05/04/2023   HGB 12.0 05/04/2023   HCT 38.9 05/04/2023   MCV 82.6  05/04/2023   PLT 229 05/04/2023      Chemistry      Component Value Date/Time   NA 139 05/04/2023 1149   K 4.1 05/04/2023 1149   CL 102 05/04/2023 1149   CO2 25 05/04/2023 1149   BUN 11 05/04/2023 1149   CREATININE 0.79 05/04/2023 1149      Component Value Date/Time   CALCIUM 9.5 05/04/2023 1149   ALKPHOS 69 05/04/2023 1149   AST 16 05/04/2023 1149   ALT 17 05/04/2023 1149   BILITOT 0.6 05/04/2023 1149       RADIOGRAPHIC STUDIES: DG Ribs Unilateral W/Chest Right  Result Date: 05/04/2023 CLINICAL DATA:  Recent right lung surgery EXAM: RIGHT RIBS AND CHEST - 3 VIEW COMPARISON:  Chest x-ray dated Apr 23, 2023 FINDINGS: No fracture or other bone lesions are seen involving the ribs. No evidence of pneumothorax or pleural effusion. Stable postsurgical changes of the right lung. Both lungs are clear. Heart size and mediastinal contours are within normal limits. IMPRESSION: 1. No displaced rib fracture. 2. Stable postsurgical changes of the right lung. 3. No acute cardiopulmonary process. Electronically Signed  By: Allegra Lai M.D.   On: 05/04/2023 14:43    ASSESSMENT: This is a very pleasant 67 years old African female recently diagnosed with stage IA (T1b, N0, M0) non-small cell lung cancer, adenocarcinoma presented with right lower lobe lung nodule status post right lower lobe superior segmentectomy with lymph node dissection under the care of Dr. Dorris Fetch on 04/08/2023.   PLAN: I had a lengthy discussion with the patient and her husband today about her current disease stage, prognosis and treatment options. I explained to the patient that she had curable treatment for her disease with the surgical resection. I also explained to the patient that there is no survival benefit for adjuvant systemic chemotherapy or radiation for patient with a stage Ia non-small cell lung cancer. I recommended for the patient to continue on observation with repeat CT scan of the chest in 6  months. The patient was advised to call immediately if she has any other concerning symptoms in the interval. The patient voices understanding of current disease status and treatment options and is in agreement with the current care plan.  All questions were answered. The patient knows to call the clinic with any problems, questions or concerns. We can certainly see the patient much sooner if necessary.  Thank you so much for allowing me to participate in the care of [facility]. I will continue to follow up the patient with you and assist in her care. The total time spent in the appointment was 60 minutes.  Disclaimer: This note was dictated with voice recognition software. Similar sounding words can inadvertently be transcribed and may not be corrected upon review.   Lajuana Matte May 28, 2023, 11:51 AM

## 2023-05-28 NOTE — Progress Notes (Signed)
[address]       [place] [address]             [phone]     HPI: April Rose returns for a scheduled follow-up visit after recent right lower lobe superior segmentectomy  April Rose is a 67 year old woman with a history of hypertension, anxiety, and a stage Ia adenocarcinoma of the lung.  First noted to have a lung nodule in 2019 but lost to follow-up.  More recently noted on a chest x-ray.  CT showed a 1.6 x 1.4 x 1.3 cm cavitary nodule in the superior segment of the right lower lobe.  I did a right lower lobe superior segmentectomy on 04/08/2023.  Final pathology showed a 1.1 cm T1b, N0, stage Ia adenocarcinoma.  Margins were clear.  She did well postoperatively and went home on day 2.  I saw her back in the office in May.  She was having a persistent cough.  I gave her a 10-day course of prednisone.  Her cough resolved.  She presented back to the emergency room on 05/04/2023 with pleuritic chest pain after coughing.  She required tramadol again after that.  She has not had to take tramadol over the past week.  Past Medical History:  Diagnosis Date   Adenocarcinoma of lung, stage 1, right (HCC)    Status post segmentectomy 04/2023   Anxiety    Hypertension     Current Outpatient Medications  Medication Sig Dispense Refill   acetaminophen (TYLENOL) 325 MG tablet Take 2 tablets (650 mg total) by mouth every 4 (four) hours as needed.     Bioflavonoid Products (ESTER C PO) Take 1 tablet by mouth daily.     olmesartan (BENICAR) 5 MG tablet Take 5 mg by mouth daily.     traMADol (ULTRAM) 50 MG tablet Take 1 tablet (50 mg total) by mouth every 6 (six) hours as needed. 24 tablet 0   gabapentin (NEURONTIN) 300 MG capsule Take 1 capsule (300 mg total) by mouth 2 (two) times daily. 60 capsule 1   No current facility-administered medications for this visit.    Physical Exam BP (!) 143/76 (BP Location: Right Arm, Patient Position: Sitting, Cuff Size: Normal)    Pulse 86   Resp 20   Ht 5\' 2"  (1.575 m)   Wt 152 lb 3.2 oz (69 kg)   SpO2 96% Comment: RA  BMI 27.84 kg/m  Well-appearing 67 year old woman in no acute distress Alert and oriented x 3 with no focal deficits Lungs clear with equal breath sounds bilaterally Cardiac regular rate and rhythm No peripheral edema  Diagnostic Tests: CHEST - 2 VIEW   COMPARISON:  May 04, 2023.   FINDINGS: The heart size and mediastinal contours are within normal limits. No acute abnormality is noted. The visualized skeletal structures are unremarkable.   IMPRESSION: No active cardiopulmonary disease.     Electronically Signed   By: Lupita Raider M.D.   On: 05/28/2023 13:23 Suture line from superior segmentectomy visible, otherwise unremarkable.  Impression: April Rose is a 67 year old non-smoker who underwent a right lower lobe superior segmentectomy for a stage Ia adenocarcinoma of the lung.  She now is about 6 weeks out from surgery.  She has not had to take any tramadol over the past week.  She still is on gabapentin 300 mg twice daily.  She is about to run out of that so I gave her a new prescription for 60 tablets  with 1 refill.  Hopefully she can come off it after that.  There are no restrictions on her activities but she was advised to build into new activities gradually.  She saw Dr. Arbutus Ped.  No adjuvant therapy was indicated.  He is planning to see her back with a CT scan in 6 months.  Plan: Return in 6 months after CT by Dr. Remi Deter, MD [facility] [phone]

## 2023-06-07 ENCOUNTER — Encounter: Payer: Self-pay | Admitting: Internal Medicine

## 2023-08-08 DIAGNOSIS — E785 Hyperlipidemia, unspecified: Secondary | ICD-10-CM | POA: Diagnosis not present

## 2023-08-08 DIAGNOSIS — R7303 Prediabetes: Secondary | ICD-10-CM | POA: Diagnosis not present

## 2023-08-08 DIAGNOSIS — I10 Essential (primary) hypertension: Secondary | ICD-10-CM | POA: Diagnosis not present

## 2023-08-11 ENCOUNTER — Other Ambulatory Visit: Payer: Self-pay | Admitting: Thoracic Surgery (Cardiothoracic Vascular Surgery)

## 2023-11-13 DIAGNOSIS — E785 Hyperlipidemia, unspecified: Secondary | ICD-10-CM | POA: Diagnosis not present

## 2023-11-13 DIAGNOSIS — R7303 Prediabetes: Secondary | ICD-10-CM | POA: Diagnosis not present

## 2023-11-13 DIAGNOSIS — I10 Essential (primary) hypertension: Secondary | ICD-10-CM | POA: Diagnosis not present

## 2023-11-16 DIAGNOSIS — Z1231 Encounter for screening mammogram for malignant neoplasm of breast: Secondary | ICD-10-CM | POA: Diagnosis not present

## 2023-11-20 ENCOUNTER — Inpatient Hospital Stay: Payer: Medicare Other | Attending: Internal Medicine

## 2023-11-20 DIAGNOSIS — C3431 Malignant neoplasm of lower lobe, right bronchus or lung: Secondary | ICD-10-CM | POA: Insufficient documentation

## 2023-11-20 DIAGNOSIS — C3490 Malignant neoplasm of unspecified part of unspecified bronchus or lung: Secondary | ICD-10-CM

## 2023-11-20 LAB — CBC WITH DIFFERENTIAL (CANCER CENTER ONLY)
Abs Immature Granulocytes: 0.01 10*3/uL (ref 0.00–0.07)
Basophils Absolute: 0 10*3/uL (ref 0.0–0.1)
Basophils Relative: 1 %
Eosinophils Absolute: 0.1 10*3/uL (ref 0.0–0.5)
Eosinophils Relative: 2 %
HCT: 40.7 % (ref 36.0–46.0)
Hemoglobin: 12.6 g/dL (ref 12.0–15.0)
Immature Granulocytes: 0 %
Lymphocytes Relative: 45 %
Lymphs Abs: 2.4 10*3/uL (ref 0.7–4.0)
MCH: 25.7 pg — ABNORMAL LOW (ref 26.0–34.0)
MCHC: 31 g/dL (ref 30.0–36.0)
MCV: 82.9 fL (ref 80.0–100.0)
Monocytes Absolute: 0.4 10*3/uL (ref 0.1–1.0)
Monocytes Relative: 7 %
Neutro Abs: 2.5 10*3/uL (ref 1.7–7.7)
Neutrophils Relative %: 45 %
Platelet Count: 210 10*3/uL (ref 150–400)
RBC: 4.91 MIL/uL (ref 3.87–5.11)
RDW: 14.4 % (ref 11.5–15.5)
WBC Count: 5.5 10*3/uL (ref 4.0–10.5)
nRBC: 0 % (ref 0.0–0.2)

## 2023-11-20 LAB — CMP (CANCER CENTER ONLY)
ALT: 12 U/L (ref 0–44)
AST: 15 U/L (ref 15–41)
Albumin: 4.3 g/dL (ref 3.5–5.0)
Alkaline Phosphatase: 73 U/L (ref 38–126)
Anion gap: 4 — ABNORMAL LOW (ref 5–15)
BUN: 9 mg/dL (ref 8–23)
CO2: 32 mmol/L (ref 22–32)
Calcium: 9.4 mg/dL (ref 8.9–10.3)
Chloride: 109 mmol/L (ref 98–111)
Creatinine: 0.69 mg/dL (ref 0.44–1.00)
GFR, Estimated: >60 mL/min (ref >60)
Glucose, Bld: 94 mg/dL (ref 70–99)
Potassium: 3.8 mmol/L (ref 3.5–5.1)
Sodium: 145 mmol/L (ref 135–145)
Total Bilirubin: 0.6 mg/dL (ref <1.2)
Total Protein: 6.6 g/dL (ref 6.5–8.1)

## 2023-11-22 ENCOUNTER — Ambulatory Visit (HOSPITAL_COMMUNITY)
Admission: RE | Admit: 2023-11-22 | Discharge: 2023-11-22 | Disposition: A | Discharge status: Home / Self Care | Payer: Medicare Other | Source: Ambulatory Visit | Attending: Internal Medicine | Admitting: Internal Medicine

## 2023-11-22 DIAGNOSIS — J929 Pleural plaque without asbestos: Secondary | ICD-10-CM | POA: Diagnosis not present

## 2023-11-22 DIAGNOSIS — C3490 Malignant neoplasm of unspecified part of unspecified bronchus or lung: Secondary | ICD-10-CM | POA: Diagnosis not present

## 2023-11-22 DIAGNOSIS — R911 Solitary pulmonary nodule: Secondary | ICD-10-CM | POA: Diagnosis not present

## 2023-11-22 MED ORDER — IOHEXOL 300 MG/ML  SOLN
75.0000 mL | Freq: Once | INTRAMUSCULAR | Status: AC | PRN
  Administered 2023-11-22: 75 mL via INTRAVENOUS

## 2023-11-26 ENCOUNTER — Ambulatory Visit: Payer: Medicare Other | Admitting: Internal Medicine

## 2023-11-29 ENCOUNTER — Telehealth: Payer: Self-pay | Admitting: Internal Medicine

## 2023-11-29 NOTE — Telephone Encounter (Signed)
Unable to leave a message for patient due to voicemail box not set up, patient's spouse is aware of rescheduled appointment times/dates

## 2023-12-02 ENCOUNTER — Inpatient Hospital Stay: Payer: Medicare Other | Admitting: Internal Medicine

## 2023-12-05 ENCOUNTER — Inpatient Hospital Stay: Payer: Medicare Other | Attending: Internal Medicine | Admitting: Internal Medicine

## 2023-12-05 VITALS — BP 137/69 | HR 75 | Temp 97.2°F | Resp 15 | Ht 62.0 in | Wt 155.4 lb

## 2023-12-05 DIAGNOSIS — C3431 Malignant neoplasm of lower lobe, right bronchus or lung: Secondary | ICD-10-CM | POA: Insufficient documentation

## 2023-12-05 DIAGNOSIS — C3490 Malignant neoplasm of unspecified part of unspecified bronchus or lung: Secondary | ICD-10-CM | POA: Diagnosis not present

## 2023-12-05 NOTE — Progress Notes (Signed)
 [facility] [phone]   [phone]  OFFICE PROGRESS NOTE  Stephane Leita DEL, MD [address] [place] [address]  DIAGNOSIS:  Stage IA (T1b, N0, M0) non-small cell lung cancer, adenocarcinoma presented with right lower lobe lung nodule  PRIOR THERAPY:  Status post right lower lobe superior segmentectomy with lymph node dissection under the care of Dr. Kerrin on 04/08/2023.   CURRENT THERAPY: Observation.  INTERVAL HISTORY: April Rose 68 y.o. female returns to the clinic today for follow-up visit accompanied by her husband.Discussed the use of AI scribe software for clinical note transcription with the patient, who gave verbal consent to proceed.  History of Present Illness   April Rose, a 68 year old individual with a history of stage 1A non-small cell lung cancer, underwent a right lower lobe superior segmentectomy in May 2024. Since the surgery, she has been under observation with no new symptoms or changes in her health status. She denies experiencing any chest pain, shortness of breath, or cough. She also denies any gastrointestinal symptoms such as nausea, vomiting, or diarrhea, and has not experienced any weight loss. She has a small, stable 5mm nodule in the severe segment of the left lower lobe that has been under observation with no noted changes. She reports no new health concerns since her last visit six months ago.       MEDICAL HISTORY: Past Medical History:  Diagnosis Date   Adenocarcinoma of lung, stage 1, right (HCC)    Status post segmentectomy 04/2023   Anxiety    Hypertension     ALLERGIES:  is allergic to lisinopril.  MEDICATIONS:  Current Outpatient Medications  Medication Sig Dispense Refill   acetaminophen  (TYLENOL ) 325 MG tablet Take 2 tablets (650 mg total) by mouth every 4 (four) hours as needed.     Bioflavonoid Products (ESTER C PO) Take 1 tablet by mouth daily.     gabapentin  (NEURONTIN ) 300 MG  capsule Take 1 capsule (300 mg total) by mouth 2 (two) times daily. 60 capsule 1   olmesartan (BENICAR) 5 MG tablet Take 5 mg by mouth daily.     traMADol  (ULTRAM ) 50 MG tablet Take 1 tablet (50 mg total) by mouth every 6 (six) hours as needed. 24 tablet 0   No current facility-administered medications for this visit.    SURGICAL HISTORY:  Past Surgical History:  Procedure Laterality Date   COLONOSCOPY     ~ 15 yrs ago   COLONOSCOPY  2024   INTERCOSTAL NERVE BLOCK Right 04/08/2023   Procedure: INTERCOSTAL NERVE BLOCK;  Surgeon: Kerrin Elspeth BROCKS, MD;  Location: [facility] OR;  Service: Thoracic;  Laterality: Right;   LYMPH NODE DISSECTION Right 04/08/2023   Procedure: LYMPH NODE DISSECTION;  Surgeon: Kerrin Elspeth BROCKS, MD;  Location: [facility] OR;  Service: Thoracic;  Laterality: Right;    REVIEW OF SYSTEMS:  A comprehensive review of systems was negative.   PHYSICAL EXAMINATION: General appearance: alert, cooperative, and no distress Head: Normocephalic, without obvious abnormality, atraumatic Neck: no adenopathy, no JVD, supple, symmetrical, trachea midline, and thyroid  not enlarged, symmetric, no tenderness/mass/nodules Lymph nodes: Cervical, supraclavicular, and axillary nodes normal. Resp: clear to auscultation bilaterally Back: symmetric, no curvature. ROM normal. No CVA tenderness. Cardio: regular rate and rhythm, S1, S2 normal, no murmur, click, rub or gallop GI: soft, non-tender; bowel sounds normal; no masses,  no organomegaly Extremities: extremities normal, atraumatic, no cyanosis or edema  ECOG PERFORMANCE STATUS: 0 - Asymptomatic  Blood pressure  137/69, pulse 75, temperature (!) 97.2 F (36.2 C), temperature source Temporal, resp. rate 15, height 5' 2 (1.575 m), weight 155 lb 6.4 oz (70.5 kg), SpO2 100%.  LABORATORY DATA: Lab Results  Component Value Date   WBC 5.5 11/20/2023   HGB 12.6 11/20/2023   HCT 40.7 11/20/2023   MCV 82.9 11/20/2023   PLT 210 11/20/2023       Chemistry      Component Value Date/Time   NA 145 11/20/2023 0846   K 3.8 11/20/2023 0846   CL 109 11/20/2023 0846   CO2 32 11/20/2023 0846   BUN 9 11/20/2023 0846   CREATININE 0.69 11/20/2023 0846      Component Value Date/Time   CALCIUM 9.4 11/20/2023 0846   ALKPHOS 73 11/20/2023 0846   AST 15 11/20/2023 0846   ALT 12 11/20/2023 0846   BILITOT 0.6 11/20/2023 0846       RADIOGRAPHIC STUDIES: CT Chest W Contrast Result Date: 11/28/2023 CLINICAL DATA:  Non-small-cell lung cancer staging. * Tracking Code: BO * EXAM: CT CHEST WITH CONTRAST TECHNIQUE: Multidetector CT imaging of the chest was performed during intravenous contrast administration. RADIATION DOSE REDUCTION: This exam was performed according to the departmental dose-optimization program which includes automated exposure control, adjustment of the mA and/or kV according to patient size and/or use of iterative reconstruction technique. CONTRAST:  75mL OMNIPAQUE  IOHEXOL  300 MG/ML  SOLN COMPARISON:  CT scan 12/10/2022 and older.  X-ray 05/28/2023. FINDINGS: Cardiovascular: Heart is nonenlarged. No significant pericardial effusion. The thoracic aorta has a normal course and caliber with a mild atherosclerotic changes. There is some enlargement of the left main pulmonary artery, unchanged from previous. Please correlate for any history including any valvular abnormality. Mediastinum/Nodes: Slightly patulous thoracic esophagus with some wall thickening along the midthorax. Small thyroid  gland. No specific abnormal lymph node enlargement seen in the axillary regions, hilum or mediastinum. Lungs/Pleura: The previous mass in the superior segment of the right lower lobe is no longer identified but there are surgical changes in this location. Bandlike areas of opacity with pleural thickening. Please correlate for surgical history specifically. No new right-sided lung mass identified. No right-sided significant pleural effusion. Small semi-solid  nodule in the superior segment left lower lobe is stable measuring 5 mm on series 6, image 48. No new left-sided dominant lung nodule. Basilar atelectasis or scarring. No left-sided pleural effusion or pneumothorax Upper Abdomen: Slight thickening of the adrenal glands. Fatty liver infiltration. Benign hepatic cyst seen at the edge of the imaging field. Musculoskeletal: Slight curvature of the spine. Multifocal degenerative changes. IMPRESSION: Interval surgical changes identified. The heterogeneous lesion in the superior segment right lower lobe is no longer identified. Stable 5 mm semi-solid nodule left lower lobe. Recommend continued follow up. No developing new mass lesion or nodal enlargement. Fatty liver infiltration. Aortic Atherosclerosis (ICD10-I70.0). Electronically Signed   By: Ranell Bring M.D.   On: 11/28/2023 15:01    ASSESSMENT AND PLAN: This is a very pleasant 68 years old African female with stage IA (T1b, N0, M0) non-small cell lung cancer, adenocarcinoma presented with right lower lobe lung nodule status post right lower lobe superior segmentectomy with lymph node dissection under the care of Dr. Kerrin on 04/08/2023.  The patient is currently on observation and she is feeling fine with no concerning complaints. She had repeat CT scan of the chest performed recently.  I personally independently reviewed the scan and discussed the result with the patient today.  Her scan showed no concerning  findings for disease recurrence or metastasis.     Stage 1A Non-Small Cell Lung Cancer (NSCLC) Diagnosed in May 2024. Underwent right lower lobe superior segmentectomy. Currently asymptomatic. Follow-up scan shows a stable 5 mm nodule in the left lower lobe and no other evidence for disease recurrence or metastases. Plan to continue observation with follow-up scans every six months for the first two years, then annually. Patient and spouse understand and agree. - Continue observation - Schedule  follow-up in six months - Perform scans every six months for the first two years, then annually.   The patient was advised to call immediately if she has any other concerning symptoms in the interval. The patient voices understanding of current disease status and treatment options and is in agreement with the current care plan.  All questions were answered. The patient knows to call the clinic with any problems, questions or concerns. We can certainly see the patient much sooner if necessary.  The total time spent in the appointment was 20 minutes.  Disclaimer: This note was dictated with voice recognition software. Similar sounding words can inadvertently be transcribed and may not be corrected upon review.

## 2023-12-24 ENCOUNTER — Encounter: Payer: Self-pay | Admitting: Thoracic Surgery (Cardiothoracic Vascular Surgery)

## 2023-12-24 ENCOUNTER — Ambulatory Visit: Payer: Medicare Other | Admitting: Thoracic Surgery (Cardiothoracic Vascular Surgery)

## 2023-12-24 VITALS — BP 126/76 | HR 79 | Resp 20 | Wt 150.0 lb

## 2023-12-24 DIAGNOSIS — Z902 Acquired absence of lung [part of]: Secondary | ICD-10-CM | POA: Diagnosis not present

## 2023-12-24 NOTE — Progress Notes (Signed)
[address]       [name] [address]             [phone]     HPI: Mrs. Horne returns for follow-up after previous superior segmentectomy for stage Ia adenocarcinoma.  April Rose is a 68 year old woman with a history of hypertension, anxiety, and stage Ia adenocarcinoma of the lung.  She was noted to have a lung nodule back in 2019 but was lost to follow-up.  In 2024 she had a chest x-ray which showed a nodule.  CT showed a 1.6 x 1.4 x 1.3 cm cavitary nodule in the superior segment of the right lower lobe.  I did a superior segmentectomy.  Pathology showed a stage Ia adenocarcinoma.  She saw Dr. Arbutus Ped.  She did not require any adjuvant therapy.  Last saw her in the office in June.  She had been off pain medication for over a week at that point.  Initially had a problem with cough but that was improving as well.  In the interim since her last visit she has been feeling well.  No problems at all related to her surgery.  Exercise tolerance is good.  She is planning to travel back to [place] again in March.  Past Medical History:  Diagnosis Date   Adenocarcinoma of lung, stage 1, right (HCC)    Status post segmentectomy 04/2023   Anxiety    Hypertension     Current Outpatient Medications  Medication Sig Dispense Refill   acetaminophen (TYLENOL) 325 MG tablet Take 2 tablets (650 mg total) by mouth every 4 (four) hours as needed.     Bioflavonoid Products (ESTER C PO) Take 1 tablet by mouth daily.     gabapentin (NEURONTIN) 300 MG capsule Take 1 capsule (300 mg total) by mouth 2 (two) times daily. 60 capsule 1   olmesartan (BENICAR) 5 MG tablet Take 5 mg by mouth daily.     No current facility-administered medications for this visit.    Physical Exam BP 126/76 (BP Location: Left Arm, Patient Position: Sitting, Cuff Size: Normal)   Pulse 79   Resp 20   Wt 150 lb (68 kg)   SpO2 97% Comment: RA  BMI 27.44 kg/m  Well-appearing 68 year old woman in no  acute distress Alert and oriented x 3 with no focal deficits Lungs clear with equal breath sounds bilaterally No cervical or supraclavicular adenopathy  Diagnostic Tests: CT CHEST WITH CONTRAST   TECHNIQUE: Multidetector CT imaging of the chest was performed during intravenous contrast administration.   RADIATION DOSE REDUCTION: This exam was performed according to the departmental dose-optimization program which includes automated exposure control, adjustment of the mA and/or kV according to patient size and/or use of iterative reconstruction technique.   CONTRAST:  75mL OMNIPAQUE IOHEXOL 300 MG/ML  SOLN   COMPARISON:  CT scan 12/10/2022 and older.  X-ray 05/28/2023.   FINDINGS: Cardiovascular: Heart is nonenlarged. No significant pericardial effusion. The thoracic aorta has a normal course and caliber with a mild atherosclerotic changes. There is some enlargement of the left main pulmonary artery, unchanged from previous. Please correlate for any history including any valvular abnormality.   Mediastinum/Nodes: Slightly patulous thoracic esophagus with some wall thickening along the midthorax. Small thyroid gland. No specific abnormal lymph node enlargement seen in the axillary regions, hilum or mediastinum.   Lungs/Pleura: The previous mass in the superior segment of the right lower lobe is no longer identified but there are surgical  changes in this location. Bandlike areas of opacity with pleural thickening. Please correlate for surgical history specifically. No new right-sided lung mass identified. No right-sided significant pleural effusion. Small semi-solid nodule in the superior segment left lower lobe is stable measuring 5 mm on series 6, image 48. No new left-sided dominant lung nodule. Basilar atelectasis or scarring. No left-sided pleural effusion or pneumothorax   Upper Abdomen: Slight thickening of the adrenal glands. Fatty liver infiltration. Benign hepatic  cyst seen at the edge of the imaging field.   Musculoskeletal: Slight curvature of the spine. Multifocal degenerative changes.   IMPRESSION: Interval surgical changes identified. The heterogeneous lesion in the superior segment right lower lobe is no longer identified.   Stable 5 mm semi-solid nodule left lower lobe. Recommend continued follow up.   No developing new mass lesion or nodal enlargement.   Fatty liver infiltration.   Aortic Atherosclerosis (ICD10-I70.0).     Electronically Signed   By: Karen Kays M.D.   On: 11/28/2023 15:01 I personally reviewed the CT images.  Postoperative changes from right lower lobe superior segmentectomy.  Stable 5 mm nodule superior segment left lower lobe.  Impression: April Rose is a 68 year old woman with a history of hypertension, anxiety, and stage Ia adenocarcinoma of the lung.   Stage Ia adenocarcinoma of the lung-now 6 months out from surgery with no evidence of recurrent disease.  Will continue to be followed by Dr. Arbutus Ped.  Status post right lower lobe superior segmentectomy-doing well with no residual pain issues.  No respiratory issues.  No restrictions on activity or travel.  Plan: Follow-up with Dr. Arbutus Ped I will be happy to see her back again anytime in the future if I can be of any further assistance with her care  Loreli Slot, MD [facility] [phone]

## 2024-02-13 DIAGNOSIS — E785 Hyperlipidemia, unspecified: Secondary | ICD-10-CM | POA: Diagnosis not present

## 2024-02-13 DIAGNOSIS — R7303 Prediabetes: Secondary | ICD-10-CM | POA: Diagnosis not present

## 2024-03-10 DIAGNOSIS — Z0000 Encounter for general adult medical examination without abnormal findings: Secondary | ICD-10-CM | POA: Diagnosis not present

## 2024-03-10 DIAGNOSIS — I10 Essential (primary) hypertension: Secondary | ICD-10-CM | POA: Diagnosis not present

## 2024-03-10 DIAGNOSIS — Z1331 Encounter for screening for depression: Secondary | ICD-10-CM | POA: Diagnosis not present

## 2024-03-10 DIAGNOSIS — Z1339 Encounter for screening examination for other mental health and behavioral disorders: Secondary | ICD-10-CM | POA: Diagnosis not present

## 2024-05-13 DIAGNOSIS — H5213 Myopia, bilateral: Secondary | ICD-10-CM | POA: Diagnosis not present

## 2024-05-25 ENCOUNTER — Encounter (HOSPITAL_COMMUNITY): Payer: Self-pay

## 2024-05-25 ENCOUNTER — Ambulatory Visit (HOSPITAL_COMMUNITY)
Admission: RE | Admit: 2024-05-25 | Discharge: 2024-05-25 | Disposition: A | Discharge status: Home / Self Care | Source: Ambulatory Visit | Attending: Internal Medicine | Admitting: Internal Medicine

## 2024-05-25 DIAGNOSIS — R911 Solitary pulmonary nodule: Secondary | ICD-10-CM | POA: Diagnosis not present

## 2024-05-25 DIAGNOSIS — I700 Atherosclerosis of aorta: Secondary | ICD-10-CM | POA: Diagnosis not present

## 2024-05-25 DIAGNOSIS — C3490 Malignant neoplasm of unspecified part of unspecified bronchus or lung: Secondary | ICD-10-CM | POA: Insufficient documentation

## 2024-05-25 DIAGNOSIS — K449 Diaphragmatic hernia without obstruction or gangrene: Secondary | ICD-10-CM | POA: Diagnosis not present

## 2024-05-25 MED ORDER — IOHEXOL 300 MG/ML  SOLN
75.0000 mL | Freq: Once | INTRAMUSCULAR | Status: AC | PRN
  Administered 2024-05-25: 75 mL via INTRAVENOUS

## 2024-05-25 MED ORDER — SODIUM CHLORIDE (PF) 0.9 % IJ SOLN
INTRAMUSCULAR | Status: AC
  Filled 2024-05-25: qty 50

## 2024-06-03 ENCOUNTER — Inpatient Hospital Stay: Payer: Medicare Other | Attending: Internal Medicine | Admitting: Internal Medicine

## 2024-06-03 ENCOUNTER — Inpatient Hospital Stay: Payer: Medicare Other

## 2024-06-03 VITALS — BP 132/82 | HR 72 | Temp 97.2°F | Resp 17 | Ht 62.0 in | Wt 140.4 lb

## 2024-06-03 DIAGNOSIS — R911 Solitary pulmonary nodule: Secondary | ICD-10-CM | POA: Insufficient documentation

## 2024-06-03 DIAGNOSIS — C3490 Malignant neoplasm of unspecified part of unspecified bronchus or lung: Secondary | ICD-10-CM

## 2024-06-03 DIAGNOSIS — C3431 Malignant neoplasm of lower lobe, right bronchus or lung: Secondary | ICD-10-CM | POA: Insufficient documentation

## 2024-06-03 LAB — CBC WITH DIFFERENTIAL (CANCER CENTER ONLY)
Abs Immature Granulocytes: 0.01 10*3/uL (ref 0.00–0.07)
Basophils Absolute: 0 10*3/uL (ref 0.0–0.1)
Basophils Relative: 0 %
Eosinophils Absolute: 0.1 10*3/uL (ref 0.0–0.5)
Eosinophils Relative: 1 %
HCT: 39.3 % (ref 36.0–46.0)
Hemoglobin: 12.9 g/dL (ref 12.0–15.0)
Immature Granulocytes: 0 %
Lymphocytes Relative: 44 %
Lymphs Abs: 2.3 10*3/uL (ref 0.7–4.0)
MCH: 26.3 pg (ref 26.0–34.0)
MCHC: 32.8 g/dL (ref 30.0–36.0)
MCV: 80.2 fL (ref 80.0–100.0)
Monocytes Absolute: 0.4 10*3/uL (ref 0.1–1.0)
Monocytes Relative: 7 %
Neutro Abs: 2.5 10*3/uL (ref 1.7–7.7)
Neutrophils Relative %: 48 %
Platelet Count: 207 10*3/uL (ref 150–400)
RBC: 4.9 MIL/uL (ref 3.87–5.11)
RDW: 14.2 % (ref 11.5–15.5)
WBC Count: 5.2 10*3/uL (ref 4.0–10.5)
nRBC: 0 % (ref 0.0–0.2)

## 2024-06-03 LAB — CMP (CANCER CENTER ONLY)
ALT: 13 U/L (ref 0–44)
AST: 16 U/L (ref 15–41)
Albumin: 4.1 g/dL (ref 3.5–5.0)
Alkaline Phosphatase: 52 U/L (ref 38–126)
Anion gap: 5 (ref 5–15)
BUN: 8 mg/dL (ref 8–23)
CO2: 30 mmol/L (ref 22–32)
Calcium: 9.7 mg/dL (ref 8.9–10.3)
Chloride: 108 mmol/L (ref 98–111)
Creatinine: 0.78 mg/dL (ref 0.44–1.00)
GFR, Estimated: >60 mL/min (ref >60)
Glucose, Bld: 92 mg/dL (ref 70–99)
Potassium: 4 mmol/L (ref 3.5–5.1)
Sodium: 143 mmol/L (ref 135–145)
Total Bilirubin: 0.6 mg/dL (ref 0.0–1.2)
Total Protein: 6.7 g/dL (ref 6.5–8.1)

## 2024-06-03 NOTE — Progress Notes (Signed)
 [facility] [phone]   Fax:(336) [phone]  OFFICE PROGRESS NOTE  Stephane Leita DEL, MD [address] [place] [address]  DIAGNOSIS:  Stage IA (T1b, N0, M0) non-small cell lung cancer, adenocarcinoma presented with right lower lobe lung nodule  PRIOR THERAPY:  Status post right lower lobe superior segmentectomy with lymph node dissection under the care of Dr. Kerrin on 04/08/2023.   CURRENT THERAPY: Observation.  INTERVAL HISTORY: April Rose 68 y.o. female returns to the clinic today for follow-up visit accompanied by her husband.Discussed the use of AI scribe software for clinical note transcription with the patient, who gave verbal consent to proceed.  History of Present Illness April Rose is a 68 year old female with stage I non-small cell lung cancer who presents for evaluation with repeat CT scan of the chest for restaging of her disease.  She is currently in the observation period following a right lower lobe superior segmentectomy performed in May 2024 for stage I non-small cell lung cancer, adenocarcinoma. A 5 mm nodule in the left lower lobe of her lung was previously found and has been under observation.  She feels good with no complaints. No chest pain, shortness of breath, cough, recent weight loss, or night sweats.    MEDICAL HISTORY: Past Medical History:  Diagnosis Date   Adenocarcinoma of lung, stage 1, right (HCC)    Status post segmentectomy 04/2023   Anxiety    Hypertension     ALLERGIES:  is allergic to lisinopril.  MEDICATIONS:  Current Outpatient Medications  Medication Sig Dispense Refill   acetaminophen  (TYLENOL ) 325 MG tablet Take 2 tablets (650 mg total) by mouth every 4 (four) hours as needed.     Bioflavonoid Products (ESTER C PO) Take 1 tablet by mouth daily.     gabapentin  (NEURONTIN ) 300 MG capsule Take 1 capsule (300 mg total) by mouth 2 (two) times daily. 60 capsule 1   olmesartan (BENICAR) 5 MG  tablet Take 5 mg by mouth daily.     No current facility-administered medications for this visit.    SURGICAL HISTORY:  Past Surgical History:  Procedure Laterality Date   COLONOSCOPY     ~ 15 yrs ago   COLONOSCOPY  2024   INTERCOSTAL NERVE BLOCK Right 04/08/2023   Procedure: INTERCOSTAL NERVE BLOCK;  Surgeon: Kerrin Elspeth BROCKS, MD;  Location: [facility] OR;  Service: Thoracic;  Laterality: Right;   LYMPH NODE DISSECTION Right 04/08/2023   Procedure: LYMPH NODE DISSECTION;  Surgeon: Kerrin Elspeth BROCKS, MD;  Location: [facility] OR;  Service: Thoracic;  Laterality: Right;    REVIEW OF SYSTEMS:  A comprehensive review of systems was negative.   PHYSICAL EXAMINATION: General appearance: alert, cooperative, and no distress Head: Normocephalic, without obvious abnormality, atraumatic Neck: no adenopathy, no JVD, supple, symmetrical, trachea midline, and thyroid  not enlarged, symmetric, no tenderness/mass/nodules Lymph nodes: Cervical, supraclavicular, and axillary nodes normal. Resp: clear to auscultation bilaterally Back: symmetric, no curvature. ROM normal. No CVA tenderness. Cardio: regular rate and rhythm, S1, S2 normal, no murmur, click, rub or gallop GI: soft, non-tender; bowel sounds normal; no masses,  no organomegaly Extremities: extremities normal, atraumatic, no cyanosis or edema  ECOG PERFORMANCE STATUS: 0 - Asymptomatic  Blood pressure 132/82, pulse 72, temperature (!) 97.2 F (36.2 C), temperature source Temporal, resp. rate 17, height 5' 2 (1.575 m), weight 140 lb 6.4 oz (63.7 kg), SpO2 100%.  LABORATORY DATA: Lab Results  Component Value Date   WBC 5.2  06/03/2024   HGB 12.9 06/03/2024   HCT 39.3 06/03/2024   MCV 80.2 06/03/2024   PLT 207 06/03/2024      Chemistry      Component Value Date/Time   NA 145 11/20/2023 0846   K 3.8 11/20/2023 0846   CL 109 11/20/2023 0846   CO2 32 11/20/2023 0846   BUN 9 11/20/2023 0846   CREATININE 0.69 11/20/2023 0846      Component  Value Date/Time   CALCIUM 9.4 11/20/2023 0846   ALKPHOS 73 11/20/2023 0846   AST 15 11/20/2023 0846   ALT 12 11/20/2023 0846   BILITOT 0.6 11/20/2023 0846       RADIOGRAPHIC STUDIES: CT Chest W Contrast Result Date: 05/29/2024 CLINICAL DATA:  Non-small-cell lung cancer staging. Previous lobectomy. * Tracking Code: BO * EXAM: CT CHEST WITH CONTRAST TECHNIQUE: Multidetector CT imaging of the chest was performed during intravenous contrast administration. RADIATION DOSE REDUCTION: This exam was performed according to the departmental dose-optimization program which includes automated exposure control, adjustment of the mA and/or kV according to patient size and/or use of iterative reconstruction technique. CONTRAST:  75mL OMNIPAQUE  IOHEXOL  300 MG/ML  SOLN COMPARISON:  CT chest 11/22/2023. FINDINGS: Cardiovascular: Heart is nonenlarged. Trace pericardial fluid or thickening. There is some asymmetric enlargement of the main left pulmonary artery, unchanged from previous. The thoracic aorta has normal course and caliber with some atherosclerotic plaque. Coronary artery calcifications are noted. Please correlate for other coronary risk factors. Mediastinum/Nodes: Small hiatal hernia. Slightly patulous thoracic esophagus. Stable thyroid  gland. No specific abnormal lymph node enlargement identified in the axillary regions, hilum or mediastinum. Lungs/Pleura: Surgical changes of right lower lobectomy. Adjacent thickening and scarring is stable. No developing new consolidation, pneumothorax or effusion. No edema. Small area of ground-glass in the superior segment left lower lobe laterally is stable measuring 5 mm on image 58. Upper Abdomen: Adrenal glands are preserved in the upper abdomen. Musculoskeletal: Mild degenerative changes along the spine with some curvature. IMPRESSION: No significant interval change. Stable surgical changes of right-sided lower lobectomy. No developing new dominant mass lesion, fluid  collection or nodal enlargement in the thorax. Stable 5 mm semi-solid left lower lobe lung nodule. Recommend continued surveillance. Aortic Atherosclerosis (ICD10-I70.0). Electronically Signed   By: Ranell Bring M.D.   On: 05/29/2024 10:59    ASSESSMENT AND PLAN: This is a very pleasant 68 years old African female with stage IA (T1b, N0, M0) non-small cell lung cancer, adenocarcinoma presented with right lower lobe lung nodule status post right lower lobe superior segmentectomy with lymph node dissection under the care of Dr. Kerrin on 04/08/2023.  The patient is currently on observation. She had repeat CT scan of the chest performed recently.  I personally independently reviewed the scan and discussed the result with the patient today. Her scan showed no concerning findings for disease recurrence or metastasis with a stable 5 mm left lower lobe nodule. Assessment and Plan Assessment & Plan Stage I non-small cell lung cancer, adenocarcinoma Status post right lower lobe superior segmentectomy in May 2024. Currently asymptomatic with no chest pain, dyspnea, cough, weight loss, or night sweats. Recent CT scan shows no new growths or changes. - Continue observation period - Schedule follow-up in six months - Perform lab tests and CT scan one week prior to next appointment  Pulmonary nodule in left lower lobe Five millimeter nodule in the left lower lobe, with no growth on recent CT scan. - Continue monitoring the pulmonary nodule  She was  advised to call immediately if she has any concerning symptoms  The patient voices understanding of current disease status and treatment options and is in agreement with the current care plan.  All questions were answered. The patient knows to call the clinic with any problems, questions or concerns. We can certainly see the patient much sooner if necessary.  The total time spent in the appointment was 20 minutes.  Disclaimer: This note was dictated with  voice recognition software. Similar sounding words can inadvertently be transcribed and may not be corrected upon review.

## 2024-06-04 ENCOUNTER — Telehealth: Payer: Self-pay | Admitting: Internal Medicine

## 2024-06-04 NOTE — Telephone Encounter (Signed)
 Left a voicemail with the scheduled appointment details.

## 2024-09-15 DIAGNOSIS — E785 Hyperlipidemia, unspecified: Secondary | ICD-10-CM | POA: Diagnosis not present

## 2024-09-15 DIAGNOSIS — R7303 Prediabetes: Secondary | ICD-10-CM | POA: Diagnosis not present

## 2024-09-15 DIAGNOSIS — I10 Essential (primary) hypertension: Secondary | ICD-10-CM | POA: Diagnosis not present

## 2024-11-30 ENCOUNTER — Ambulatory Visit (HOSPITAL_COMMUNITY)
Admission: RE | Admit: 2024-11-30 | Discharge: 2024-11-30 | Disposition: A | Source: Ambulatory Visit | Attending: Internal Medicine | Admitting: Internal Medicine

## 2024-11-30 DIAGNOSIS — C3490 Malignant neoplasm of unspecified part of unspecified bronchus or lung: Secondary | ICD-10-CM | POA: Insufficient documentation

## 2024-11-30 MED ORDER — IOHEXOL 300 MG/ML  SOLN
75.0000 mL | Freq: Once | INTRAMUSCULAR | Status: AC | PRN
  Administered 2024-11-30: 75 mL via INTRAVENOUS

## 2024-12-01 ENCOUNTER — Inpatient Hospital Stay: Attending: Internal Medicine

## 2024-12-01 DIAGNOSIS — Z85118 Personal history of other malignant neoplasm of bronchus and lung: Secondary | ICD-10-CM | POA: Diagnosis present

## 2024-12-01 DIAGNOSIS — R911 Solitary pulmonary nodule: Secondary | ICD-10-CM | POA: Diagnosis not present

## 2024-12-01 DIAGNOSIS — C3490 Malignant neoplasm of unspecified part of unspecified bronchus or lung: Secondary | ICD-10-CM

## 2024-12-01 LAB — CMP (CANCER CENTER ONLY)
ALT: 70 U/L — ABNORMAL HIGH (ref 0–44)
AST: 52 U/L — ABNORMAL HIGH (ref 15–41)
Albumin: 4.5 g/dL (ref 3.5–5.0)
Alkaline Phosphatase: 83 U/L (ref 38–126)
Anion gap: 7 (ref 5–15)
BUN: 13 mg/dL (ref 8–23)
CO2: 28 mmol/L (ref 22–32)
Calcium: 9.9 mg/dL (ref 8.9–10.3)
Chloride: 108 mmol/L (ref 98–111)
Creatinine: 0.83 mg/dL (ref 0.44–1.00)
GFR, Estimated: >60 mL/min
Glucose, Bld: 93 mg/dL (ref 70–99)
Potassium: 4.6 mmol/L (ref 3.5–5.1)
Sodium: 143 mmol/L (ref 135–145)
Total Bilirubin: 0.5 mg/dL (ref 0.0–1.2)
Total Protein: 7.3 g/dL (ref 6.5–8.1)

## 2024-12-01 LAB — CBC WITH DIFFERENTIAL (CANCER CENTER ONLY)
Abs Immature Granulocytes: 0.01 K/uL (ref 0.00–0.07)
Basophils Absolute: 0 K/uL (ref 0.0–0.1)
Basophils Relative: 1 %
Eosinophils Absolute: 0.1 K/uL (ref 0.0–0.5)
Eosinophils Relative: 1 %
HCT: 40.9 % (ref 36.0–46.0)
Hemoglobin: 13.1 g/dL (ref 12.0–15.0)
Immature Granulocytes: 0 %
Lymphocytes Relative: 48 %
Lymphs Abs: 2.4 K/uL (ref 0.7–4.0)
MCH: 25.8 pg — ABNORMAL LOW (ref 26.0–34.0)
MCHC: 32 g/dL (ref 30.0–36.0)
MCV: 80.5 fL (ref 80.0–100.0)
Monocytes Absolute: 0.3 K/uL (ref 0.1–1.0)
Monocytes Relative: 6 %
Neutro Abs: 2.2 K/uL (ref 1.7–7.7)
Neutrophils Relative %: 44 %
Platelet Count: 193 K/uL (ref 150–400)
RBC: 5.08 MIL/uL (ref 3.87–5.11)
RDW: 14.3 % (ref 11.5–15.5)
WBC Count: 4.9 K/uL (ref 4.0–10.5)
nRBC: 0 % (ref 0.0–0.2)

## 2024-12-08 ENCOUNTER — Ambulatory Visit: Admitting: Internal Medicine

## 2024-12-09 ENCOUNTER — Inpatient Hospital Stay: Attending: Internal Medicine | Admitting: Internal Medicine

## 2024-12-09 VITALS — BP 122/76 | HR 86 | Temp 97.8°F | Resp 17 | Ht 62.0 in | Wt 141.9 lb

## 2024-12-09 DIAGNOSIS — Z85118 Personal history of other malignant neoplasm of bronchus and lung: Secondary | ICD-10-CM | POA: Insufficient documentation

## 2024-12-09 DIAGNOSIS — R748 Abnormal levels of other serum enzymes: Secondary | ICD-10-CM | POA: Insufficient documentation

## 2024-12-09 DIAGNOSIS — C3490 Malignant neoplasm of unspecified part of unspecified bronchus or lung: Secondary | ICD-10-CM

## 2024-12-09 NOTE — Progress Notes (Signed)
 "     [facility] [phone]   Fax:(336) [phone]  OFFICE PROGRESS NOTE  April Leita DEL, MD [address] [place] [address]  DIAGNOSIS:  Stage IA (T1b, N0, M0) non-small cell lung cancer, adenocarcinoma presented with right lower lobe lung nodule  PRIOR THERAPY:  Status post right lower lobe superior segmentectomy with lymph node dissection under the care of Dr. Kerrin on 04/08/2023.   CURRENT THERAPY: Observation.  INTERVAL HISTORY: April Rose 69 y.o. female returns to the clinic today for follow-up visit accompanied by her son who is visiting from [place]. Discussed the use of AI scribe software for clinical note transcription with the patient, who gave verbal consent to proceed.  History of Present Illness April Rose is a 69 year old female with stage I non-small cell lung adenocarcinoma status post right lower lobe superior segmentectomy who presents for surveillance and repeat chest CT for restaging.  She underwent right lower lobe superior segmentectomy for stage I non-small cell lung adenocarcinoma in May 2024 and has been under surveillance since surgery. She remains asymptomatic, with no chest pain, dyspnea, nausea, vomiting, diarrhea, or headaches. She has not experienced any new or concerning symptoms since her last visit.  Recent chest CT for restaging demonstrated no evidence of recurrent or residual disease. She is approaching two years post-surgery and remains clinically stable.  Laboratory studies revealed mild elevation of two hepatic enzymes. She attributes this to increased alcohol  and sugary beverage intake during the recent holiday season. She denies symptoms of hepatic dysfunction.    MEDICAL HISTORY: Past Medical History:  Diagnosis Date   Adenocarcinoma of lung, stage 1, right (HCC)    Status post segmentectomy 04/2023   Anxiety    Hypertension     ALLERGIES:  is allergic to lisinopril.  MEDICATIONS:  Current  Outpatient Medications  Medication Sig Dispense Refill   acetaminophen  (TYLENOL ) 325 MG tablet Take 2 tablets (650 mg total) by mouth every 4 (four) hours as needed.     Bioflavonoid Products (ESTER C PO) Take 1 tablet by mouth daily.     gabapentin  (NEURONTIN ) 300 MG capsule Take 1 capsule (300 mg total) by mouth 2 (two) times daily. 60 capsule 1   olmesartan (BENICAR) 5 MG tablet Take 5 mg by mouth daily.     No current facility-administered medications for this visit.    SURGICAL HISTORY:  Past Surgical History:  Procedure Laterality Date   COLONOSCOPY     ~ 15 yrs ago   COLONOSCOPY  2024   INTERCOSTAL NERVE BLOCK Right 04/08/2023   Procedure: INTERCOSTAL NERVE BLOCK;  Surgeon: Kerrin Elspeth BROCKS, MD;  Location: [facility] OR;  Service: Thoracic;  Laterality: Right;   LYMPH NODE DISSECTION Right 04/08/2023   Procedure: LYMPH NODE DISSECTION;  Surgeon: Kerrin Elspeth BROCKS, MD;  Location: [facility] OR;  Service: Thoracic;  Laterality: Right;    REVIEW OF SYSTEMS:  A comprehensive review of systems was negative.   PHYSICAL EXAMINATION: General appearance: alert, cooperative, and no distress Head: Normocephalic, without obvious abnormality, atraumatic Neck: no adenopathy, no JVD, supple, symmetrical, trachea midline, and thyroid  not enlarged, symmetric, no tenderness/mass/nodules Lymph nodes: Cervical, supraclavicular, and axillary nodes normal. Resp: clear to auscultation bilaterally Back: symmetric, no curvature. ROM normal. No CVA tenderness. Cardio: regular rate and rhythm, S1, S2 normal, no murmur, click, rub or gallop GI: soft, non-tender; bowel sounds normal; no masses,  no organomegaly Extremities: extremities normal, atraumatic, no cyanosis or edema  ECOG PERFORMANCE STATUS: 0 - Asymptomatic  Blood pressure 122/76, pulse 86, temperature 97.8 F (36.6 C), temperature source Temporal, resp. rate 17, height 5' 2 (1.575 m), weight 141 lb 14.4 oz (64.4 kg), SpO2 100%.  LABORATORY  DATA: Lab Results  Component Value Date   WBC 4.9 12/01/2024   HGB 13.1 12/01/2024   HCT 40.9 12/01/2024   MCV 80.5 12/01/2024   PLT 193 12/01/2024      Chemistry      Component Value Date/Time   NA 143 12/01/2024 0934   K 4.6 12/01/2024 0934   CL 108 12/01/2024 0934   CO2 28 12/01/2024 0934   BUN 13 12/01/2024 0934   CREATININE 0.83 12/01/2024 0934      Component Value Date/Time   CALCIUM 9.9 12/01/2024 0934   ALKPHOS 83 12/01/2024 0934   AST 52 (H) 12/01/2024 0934   ALT 70 (H) 12/01/2024 0934   BILITOT 0.5 12/01/2024 0934       RADIOGRAPHIC STUDIES: CT Chest W Contrast Result Date: 12/08/2024 EXAM: CT CHEST WITH CONTRAST 11/30/2024 03:59:21 PM TECHNIQUE: CT of the chest was performed with the administration of 75 mL iohexol  (OMNIPAQUE ) 300 MG/ML solution. Multiplanar reformatted images are provided for review. Automated exposure control, iterative reconstruction, and/or weight based adjustment of the mA/kV was utilized to reduce the radiation dose to as low as reasonably achievable. COMPARISON: 05/25/2024 CLINICAL HISTORY: Non-small cell lung cancer (NSCLC), staging. * Tracking Code: BO * FINDINGS: MEDIASTINUM: Heart and pericardium are unremarkable. The central airways are clear. LYMPH NODES: No mediastinal, hilar or axillary lymphadenopathy. LUNGS AND PLEURA: No focal consolidation or pulmonary edema. No pleural effusion or pneumothorax. Mild biapical pleuroparenchymal scarring. Postoperative findings in the right lower lobe with stable bandlike scarring. No contour change. 5 mm in diameter superior segment left lower lobe subsegmental nodule on image 43 series 6, no change from earliest available comparison of 04/03/2018, indicating a high likelihood of benign nature. This nodule is not felt to require independent follow up based on the long term stability. SOFT TISSUES/BONES: No acute abnormality of the bones or soft tissues. UPPER ABDOMEN: Limited images of the upper abdomen  demonstrates a small type 1 hiatal hernia. IMPRESSION: 1. No evidence of active malignancye in the chest. 2. Stable postoperative changes in the right lower lobe with bandlike scarring. 3. Stable 5 mm subsolid left lower lobe nodule, unchanged from 04/03/18, consistent with a benign nodule. 4. Small type 1 hiatal hernia. Electronically signed by: Ryan Salvage MD 12/08/2024 04:37 PM EST RP Workstation: [id number]    ASSESSMENT AND PLAN: This is a very pleasant 69 years old African female with stage IA (T1b, N0, M0) non-small cell lung cancer, adenocarcinoma presented with right lower lobe lung nodule status post right lower lobe superior segmentectomy with lymph node dissection under the care of Dr. Kerrin on 04/08/2023.  The patient is currently on observation. She had repeat CT scan of the chest performed recently.  I personally independently reviewed the scan and discussed the results with the patient and her sister today.  Her scan showed no concerning findings for disease recurrence or metastasis. Assessment and Plan Assessment & Plan Stage I adenocarcinoma of the right lower lobe, status post segmentectomy Status post right lower lobe superior segmentectomy for stage I non-small cell lung adenocarcinoma in May 2024. Asymptomatic with no evidence of recurrent or residual disease on most recent chest CT. Nearly two years post-surgery and remains well under observation. - Reviewed recent chest CT demonstrating no evidence of recurrence. - Provided copy of scan results. -  Transitioned surveillance to annual follow-up given stability and time since surgery.  Elevated liver enzymes Mild elevation of two hepatic enzymes, likely secondary to increased alcohol  and sugary beverage intake during the holiday season. No associated symptoms or concerning features. - Advised reduction of alcohol  and sugary beverage intake to facilitate normalization of liver enzymes. The patient was advised to call  immediately if she has any concerning symptoms in the interval. The patient voices understanding of current disease status and treatment options and is in agreement with the current care plan.  All questions were answered. The patient knows to call the clinic with any problems, questions or concerns. We can certainly see the patient much sooner if necessary.  The total time spent in the appointment was 20 minutes.  Disclaimer: This note was dictated with voice recognition software. Similar sounding words can inadvertently be transcribed and may not be corrected upon review.        "

## 2025-12-01 ENCOUNTER — Inpatient Hospital Stay

## 2025-12-08 ENCOUNTER — Inpatient Hospital Stay: Admitting: Internal Medicine
